# Patient Record
Sex: Female | Born: 1945 | Race: Black or African American | Hispanic: No | Marital: Single | Smoking: Never smoker
Health system: Southern US, Community
[De-identification: ages and names within clinical notes are randomized; demographics above are authoritative.]

## PROBLEM LIST (undated history)

## (undated) DIAGNOSIS — K589 Irritable bowel syndrome without diarrhea: Secondary | ICD-10-CM

## (undated) DIAGNOSIS — E119 Type 2 diabetes mellitus without complications: Secondary | ICD-10-CM

## (undated) DIAGNOSIS — Z860100 Personal history of colon polyps, unspecified: Secondary | ICD-10-CM

## (undated) DIAGNOSIS — F32A Depression, unspecified: Secondary | ICD-10-CM

## (undated) DIAGNOSIS — F329 Major depressive disorder, single episode, unspecified: Secondary | ICD-10-CM

## (undated) DIAGNOSIS — F419 Anxiety disorder, unspecified: Secondary | ICD-10-CM

## (undated) DIAGNOSIS — M1990 Unspecified osteoarthritis, unspecified site: Secondary | ICD-10-CM

## (undated) DIAGNOSIS — K5792 Diverticulitis of intestine, part unspecified, without perforation or abscess without bleeding: Secondary | ICD-10-CM

## (undated) DIAGNOSIS — E785 Hyperlipidemia, unspecified: Secondary | ICD-10-CM

## (undated) HISTORY — DX: Anxiety disorder, unspecified: F41.9

## (undated) HISTORY — PX: CATARACT EXTRACTION, BILATERAL: SHX1313

## (undated) HISTORY — DX: Personal history of colonic polyps: Z86.010

## (undated) HISTORY — DX: Personal history of colon polyps, unspecified: Z86.0100

## (undated) HISTORY — DX: Type 2 diabetes mellitus without complications: E11.9

## (undated) HISTORY — PX: COLONOSCOPY: SHX174

## (undated) HISTORY — DX: Depression, unspecified: F32.A

## (undated) HISTORY — DX: Hyperlipidemia, unspecified: E78.5

## (undated) HISTORY — PX: ABDOMINAL EXPLORATION SURGERY: SHX538

## (undated) HISTORY — DX: Unspecified osteoarthritis, unspecified site: M19.90

## (undated) HISTORY — DX: Major depressive disorder, single episode, unspecified: F32.9

## (undated) HISTORY — PX: ABDOMINAL HYSTERECTOMY: SHX81

## (undated) HISTORY — PX: TONSILLECTOMY: SUR1361

## (undated) HISTORY — PX: BREAST BIOPSY: SHX20

## (undated) HISTORY — DX: Irritable bowel syndrome, unspecified: K58.9

## (undated) HISTORY — DX: Diverticulitis of intestine, part unspecified, without perforation or abscess without bleeding: K57.92

## (undated) HISTORY — PX: DILATION AND CURETTAGE OF UTERUS: SHX78

## (undated) HISTORY — PX: POLYPECTOMY: SHX149

---

## 2012-01-16 LAB — HM COLONOSCOPY

## 2015-08-12 DIAGNOSIS — E119 Type 2 diabetes mellitus without complications: Secondary | ICD-10-CM | POA: Diagnosis not present

## 2015-08-12 DIAGNOSIS — K5792 Diverticulitis of intestine, part unspecified, without perforation or abscess without bleeding: Secondary | ICD-10-CM | POA: Diagnosis not present

## 2015-10-01 ENCOUNTER — Other Ambulatory Visit (INDEPENDENT_AMBULATORY_CARE_PROVIDER_SITE_OTHER): Payer: Medicare Other

## 2015-10-01 ENCOUNTER — Ambulatory Visit (INDEPENDENT_AMBULATORY_CARE_PROVIDER_SITE_OTHER): Payer: Medicare Other | Admitting: Internal Medicine

## 2015-10-01 ENCOUNTER — Encounter: Payer: Self-pay | Admitting: Internal Medicine

## 2015-10-01 VITALS — BP 130/82 | HR 87 | Temp 98.2°F | Resp 16 | Ht 63.0 in | Wt 215.0 lb

## 2015-10-01 DIAGNOSIS — F32A Depression, unspecified: Secondary | ICD-10-CM | POA: Insufficient documentation

## 2015-10-01 DIAGNOSIS — K5732 Diverticulitis of large intestine without perforation or abscess without bleeding: Secondary | ICD-10-CM | POA: Diagnosis not present

## 2015-10-01 DIAGNOSIS — M1711 Unilateral primary osteoarthritis, right knee: Secondary | ICD-10-CM | POA: Insufficient documentation

## 2015-10-01 DIAGNOSIS — M129 Arthropathy, unspecified: Secondary | ICD-10-CM

## 2015-10-01 DIAGNOSIS — E785 Hyperlipidemia, unspecified: Secondary | ICD-10-CM

## 2015-10-01 DIAGNOSIS — F329 Major depressive disorder, single episode, unspecified: Secondary | ICD-10-CM

## 2015-10-01 DIAGNOSIS — F419 Anxiety disorder, unspecified: Secondary | ICD-10-CM | POA: Insufficient documentation

## 2015-10-01 DIAGNOSIS — E119 Type 2 diabetes mellitus without complications: Secondary | ICD-10-CM

## 2015-10-01 LAB — CBC WITH DIFFERENTIAL/PLATELET
BASOS ABS: 0 10*3/uL (ref 0.0–0.1)
Basophils Relative: 0.7 % (ref 0.0–3.0)
Eosinophils Absolute: 0.2 10*3/uL (ref 0.0–0.7)
Eosinophils Relative: 3.3 % (ref 0.0–5.0)
HCT: 40.3 % (ref 36.0–46.0)
HEMOGLOBIN: 13.6 g/dL (ref 12.0–15.0)
LYMPHS ABS: 2.6 10*3/uL (ref 0.7–4.0)
Lymphocytes Relative: 42 % (ref 12.0–46.0)
MCHC: 33.8 g/dL (ref 30.0–36.0)
MCV: 83.7 fl (ref 78.0–100.0)
MONOS PCT: 7.7 % (ref 3.0–12.0)
Monocytes Absolute: 0.5 10*3/uL (ref 0.1–1.0)
NEUTROS PCT: 46.3 % (ref 43.0–77.0)
Neutro Abs: 2.9 10*3/uL (ref 1.4–7.7)
Platelets: 282 10*3/uL (ref 150.0–400.0)
RBC: 4.81 Mil/uL (ref 3.87–5.11)
RDW: 15.1 % (ref 11.5–15.5)
WBC: 6.2 10*3/uL (ref 4.0–10.5)

## 2015-10-01 LAB — COMPREHENSIVE METABOLIC PANEL
ALBUMIN: 4.1 g/dL (ref 3.5–5.2)
ALK PHOS: 103 U/L (ref 39–117)
ALT: 21 U/L (ref 0–35)
AST: 23 U/L (ref 0–37)
BILIRUBIN TOTAL: 0.7 mg/dL (ref 0.2–1.2)
BUN: 11 mg/dL (ref 6–23)
CO2: 29 mEq/L (ref 19–32)
Calcium: 9.4 mg/dL (ref 8.4–10.5)
Chloride: 104 mEq/L (ref 96–112)
Creatinine, Ser: 1.07 mg/dL (ref 0.40–1.20)
GFR: 65.37 mL/min (ref >60.00)
GLUCOSE: 165 mg/dL — AB (ref 70–99)
Potassium: 4.5 mEq/L (ref 3.5–5.1)
SODIUM: 139 meq/L (ref 135–145)
TOTAL PROTEIN: 7.3 g/dL (ref 6.0–8.3)

## 2015-10-01 LAB — MICROALBUMIN / CREATININE URINE RATIO
Creatinine,U: 230.4 mg/dL
MICROALB UR: 10.1 mg/dL — AB (ref 0.0–1.9)
MICROALB/CREAT RATIO: 4.4 mg/g (ref 0.0–30.0)

## 2015-10-01 LAB — LIPID PANEL
CHOLESTEROL: 190 mg/dL (ref 0–200)
HDL: 47.4 mg/dL (ref >39.00)
LDL Cholesterol: 107 mg/dL — ABNORMAL HIGH (ref 0–99)
NonHDL: 142.57
Total CHOL/HDL Ratio: 4
Triglycerides: 178 mg/dL — ABNORMAL HIGH (ref 0.0–149.0)
VLDL: 35.6 mg/dL (ref 0.0–40.0)

## 2015-10-01 LAB — TSH: TSH: 2.23 u[IU]/mL (ref 0.35–4.50)

## 2015-10-01 LAB — HEMOGLOBIN A1C: HEMOGLOBIN A1C: 7 % — AB (ref 4.6–6.5)

## 2015-10-01 NOTE — Assessment & Plan Note (Signed)
Recent episode treated with flagyl / cipro Symptoms have resolved She will let me know if she has recurrent symptoms - will need to start antibiotics quickly Will get out records to review last colonoscopy

## 2015-10-01 NOTE — Assessment & Plan Note (Signed)
Controlled per patient Continue metformin at current dose Check a1c, urine micro Stressed regular exercise, weight loss Continue diabetic diet

## 2015-10-01 NOTE — Assessment & Plan Note (Signed)
Taking crestor 40 mg daily - no side effects Check lipid panel, tsh, cmp Stressed regular exercise, weight loss

## 2015-10-01 NOTE — Progress Notes (Signed)
Subjective:    Patient ID: Veronica Carrillo, female    DOB: 1945/09/29, 70 y.o.   MRN: [id number]  HPI She is here to establish with a new pcp.  She moved recently from [place].   Diabetes: She is taking her medication daily as prescribed. She is compliant with a diabetic diet. She is exercising, but not as much as she should.  Her diabetes has been well controlled. She checks her feet daily and denies foot lesions. She is up-to-date with an ophthalmology examination.   Hyperlipidemia: She is taking her medication daily. She is compliant with a low fat/cholesterol diet. She is not currently exercising regularly. She denies myalgias.   Depression, anxiety: She is taking her medication daily as prescribed. She denies any side effects from the medication. She feels her depression and anxiety are well controlled and she is happy with her current dose of medication.   Diverticulitis:  She has had 4-5 episodes of diverticulitis. She had an episode last month and had to go to urgent care.  They did prescribe her flagyl and cipro and she feels her symptoms have resolved.  She has had a colonoscopy in the past and is unsure if she is due for one now. She currently denies fever, abdominal pain and her BM are normal.  Medications and allergies reviewed with patient and updated if appropriate.  Patient Active Problem List   Diagnosis Date Noted  . Hyperlipidemia 10/01/2015  . Diverticulitis of colon 10/01/2015  . Diabetes ([place]) 10/01/2015  . Depression 10/01/2015  . Anxiety 10/01/2015    No current outpatient prescriptions on file prior to visit.   No current facility-administered medications on file prior to visit.    Past Medical History  Diagnosis Date  . Hyperlipidemia   . Depression   . Diabetes mellitus without complication ([place])   . Diverticulitis   . History of colon polyps     Past Surgical History  Procedure Laterality Date  . Abdominal hysterectomy    . Tonsillectomy       Social History   Social History  . Marital Status: Single    Spouse Name: N/A  . Number of Children: N/A  . Years of Education: N/A   Social History Main Topics  . Smoking status: Never Smoker   . Smokeless tobacco: Never Used  . Alcohol Use: No  . Drug Use: No  . Sexual Activity: Not Asked   Other Topics Concern  . None   Social History Narrative   Exercise: [employer], walking      Moved from [place]    Family History  Problem Relation Age of Onset  . Hypertension Mother   . Bladder Cancer Father   . Heart disease Father   . Diabetes Paternal Grandmother     Review of Systems  Constitutional: Negative for fever, chills and appetite change.  Eyes: Negative for visual disturbance.  Respiratory: Positive for cough (occasional, dry). Negative for shortness of breath and wheezing.   Cardiovascular: Negative for chest pain, palpitations and leg swelling.  Gastrointestinal: Negative for nausea, abdominal pain, diarrhea, constipation and blood in stool.       No gerd  Genitourinary: Negative for dysuria and hematuria.  Musculoskeletal: Positive for arthralgias (right knee pain, occ). Negative for myalgias and back pain.  Neurological: Negative for dizziness, light-headedness, numbness and headaches.  Psychiatric/Behavioral: Positive for dysphoric mood (controlled). The patient is nervous/anxious (controlled).        Objective:   Filed Vitals:  10/01/15 0759  BP: 130/82  Pulse: 87  Temp: 98.2 F (36.8 C)  Resp: 16   Filed Weights   10/01/15 0759  Weight: 215 lb (97.523 kg)   Body mass index is 38.09 kg/(m^2).   Physical Exam Constitutional: She appears well-developed and well-nourished. No distress.  HENT:  Head: Normocephalic and atraumatic.  Right Ear: External ear normal. Normal ear canal and TM Left Ear: External ear normal.  Normal ear canal and TM Mouth/Throat: Oropharynx is clear and moist.  Eyes: Conjunctivae and EOM are normal.  Neck: Neck supple.  No tracheal deviation present. No thyromegaly present.  No carotid bruit  Cardiovascular: Normal rate, regular rhythm and normal heart sounds.   No murmur heard.  No edema. Pulmonary/Chest: Effort normal and breath sounds normal. No respiratory distress. She has no wheezes. She has no rales.  Abdominal: Soft. She exhibits no distension. There is no tenderness.  Lymphadenopathy: She has no cervical adenopathy.  Skin: Skin is warm and dry. She is not diaphoretic.  Psychiatric: She has a normal mood and affect. Her behavior is normal.       Assessment & Plan:   See Problem List for Assessment and Plan of chronic medical problems.   F/u in 6 months  Binnie Rail, MD

## 2015-10-01 NOTE — Assessment & Plan Note (Signed)
Controlled, stable Continue current dose of medication  

## 2015-10-01 NOTE — Progress Notes (Signed)
Pre visit review using our clinic review tool, if applicable. No additional management support is needed unless otherwise documented below in the visit note. 

## 2015-10-01 NOTE — Patient Instructions (Addendum)
  Test(s) ordered today. Your results will be released to MyChart (or called to you) after review, usually within 72hours after test completion. If any changes need to be made, you will be notified at that same time.  No immunizations administered today.   Medications reviewed and updated.  No changes recommended at this time.  Please followup in 6 months   

## 2015-10-07 ENCOUNTER — Telehealth: Payer: Self-pay | Admitting: Internal Medicine

## 2015-10-07 NOTE — Telephone Encounter (Signed)
Rec'd [facility] forward 7 pages to Dr.Burns

## 2015-10-11 ENCOUNTER — Telehealth: Payer: Self-pay | Admitting: Internal Medicine

## 2015-10-11 NOTE — Telephone Encounter (Signed)
Rec'd from [facility] forward 172 pages to Dr.Burns

## 2015-12-06 ENCOUNTER — Encounter: Payer: Self-pay | Admitting: Internal Medicine

## 2015-12-06 ENCOUNTER — Ambulatory Visit (INDEPENDENT_AMBULATORY_CARE_PROVIDER_SITE_OTHER): Payer: Medicare Other | Admitting: Internal Medicine

## 2015-12-06 VITALS — BP 152/88 | HR 99 | Temp 98.1°F | Resp 16 | Wt 216.0 lb

## 2015-12-06 DIAGNOSIS — K5732 Diverticulitis of large intestine without perforation or abscess without bleeding: Secondary | ICD-10-CM | POA: Diagnosis not present

## 2015-12-06 MED ORDER — CIPROFLOXACIN HCL 500 MG PO TABS: 500.0000 mg | ORAL_TABLET | Freq: Two times a day (BID) | ORAL | 0 refills | Status: DC

## 2015-12-06 MED ORDER — METRONIDAZOLE 500 MG PO TABS: 500.0000 mg | ORAL_TABLET | Freq: Three times a day (TID) | ORAL | 0 refills | Status: DC

## 2015-12-06 NOTE — Progress Notes (Signed)
Pre visit review using our clinic review tool, if applicable. No additional management support is needed unless otherwise documented below in the visit note. 

## 2015-12-06 NOTE — Patient Instructions (Addendum)
A ct scan has been ordered.    Antibiotics have been sent to your pharmacy.  Take them as prescribed.    Call if you are not feeling better or if you feel worse.      Diverticulitis Diverticulitis is inflammation or infection of small pouches in your colon that form when you have a condition called diverticulosis. The pouches in your colon are called diverticula. Your colon, or large intestine, is where water is absorbed and stool is formed. Complications of diverticulitis can include:  Bleeding.  Severe infection.  Severe pain.  Perforation of your colon.  Obstruction of your colon. CAUSES  Diverticulitis is caused by bacteria. Diverticulitis happens when stool becomes trapped in diverticula. This allows bacteria to grow in the diverticula, which can lead to inflammation and infection. RISK FACTORS People with diverticulosis are at risk for diverticulitis. Eating a diet that does not include enough fiber from fruits and vegetables may make diverticulitis more likely to develop. SYMPTOMS  Symptoms of diverticulitis may include:  Abdominal pain and tenderness. The pain is normally located on the left side of the abdomen, but may occur in other areas.  Fever and chills.  Bloating.  Cramping.  Nausea.  Vomiting.  Constipation.  Diarrhea.  Blood in your stool. DIAGNOSIS  Your health care provider will ask you about your medical history and do a physical exam. You may need to have tests done because many medical conditions can cause the same symptoms as diverticulitis. Tests may include:  Blood tests.  Urine tests.  Imaging tests of the abdomen, including X-rays and CT scans. When your condition is under control, your health care provider may recommend that you have a colonoscopy. A colonoscopy can show how severe your diverticula are and whether something else is causing your symptoms. TREATMENT  Most cases of diverticulitis are mild and can be treated at home.  Treatment may include:  Taking over-the-counter pain medicines.  Following a clear liquid diet.  Taking antibiotic medicines by mouth for 7-10 days. More severe cases may be treated at a hospital. Treatment may include:  Not eating or drinking.  Taking prescription pain medicine.  Receiving antibiotic medicines through an IV tube.  Receiving fluids and nutrition through an IV tube.  Surgery. HOME CARE INSTRUCTIONS   Follow your health care provider's instructions carefully.  Follow a full liquid diet or other diet as directed by your health care provider. After your symptoms improve, your health care provider may tell you to change your diet. He or she may recommend you eat a high-fiber diet. Fruits and vegetables are good sources of fiber. Fiber makes it easier to pass stool.  Take fiber supplements or probiotics as directed by your health care provider.  Only take medicines as directed by your health care provider.  Keep all your follow-up appointments. SEEK MEDICAL CARE IF:   Your pain does not improve.  You have a hard time eating food.  Your bowel movements do not return to normal. SEEK IMMEDIATE MEDICAL CARE IF:   Your pain becomes worse.  Your symptoms do not get better.  Your symptoms suddenly get worse.  You have a fever.  You have repeated vomiting.  You have bloody or black, tarry stools. MAKE SURE YOU:   Understand these instructions.  Will watch your condition.  Will get help right away if you are not doing well or get worse.   This information is not intended to replace advice given to you by your health care  provider. Make sure you discuss any questions you have with your health care provider.   Document Released: 01/04/2005 Document Revised: 04/01/2013 Document Reviewed: 02/19/2013 Elsevier Interactive Patient Education [employer].

## 2015-12-06 NOTE — Progress Notes (Signed)
Subjective:    Patient ID: Veronica Carrillo, female    DOB: [birth year], 70 y.o.   MRN: [id number]  HPI He is here for an acute visit.   Two days ago she started having abdominal pain.  It was a deep hurt. It is located in the LLQ.  Yesterday she still had pain.  She did not feel like eating, but drank broth.  Today the pain is still there, but varies in intensity.  She states less frequent bowels - the stool is smaller.  She denies blood in the stool.  She denies fever/chills. She states decreased appetite.  She feels this is similar to her other diverticulitis episodes.   Her last episode was in May of this year.  She has had at least 4 other episodes.    Medications and allergies reviewed with patient and updated if appropriate.  Patient Active Problem List   Diagnosis Date Noted  . Hyperlipidemia 10/01/2015  . Diverticulitis of colon 10/01/2015  . Diabetes ([place]) 10/01/2015  . Depression 10/01/2015  . Anxiety 10/01/2015  . Arthritis of right knee 10/01/2015    Current Outpatient Prescriptions on File Prior to Visit  Medication Sig Dispense Refill  . metFORMIN (GLUCOPHAGE) 500 MG tablet Take by mouth daily.    . rosuvastatin (CRESTOR) 40 MG tablet Take 40 mg by mouth daily.    Marland Kitchen venlafaxine XR (EFFEXOR-XR) 75 MG 24 hr capsule Take 75 mg by mouth daily with breakfast.     No current facility-administered medications on file prior to visit.     Past Medical History:  Diagnosis Date  . Depression   . Diabetes mellitus without complication ([place])   . Diverticulitis   . History of colon polyps   . Hyperlipidemia     Past Surgical History:  Procedure Laterality Date  . ABDOMINAL HYSTERECTOMY    . TONSILLECTOMY      Social History   Social History  . Marital status: Single    Spouse name: N/A  . Number of children: N/A  . Years of education: N/A   Social History Main Topics  . Smoking status: Never Smoker  . Smokeless tobacco: Never Used  . Alcohol use No  .  Drug use: No  . Sexual activity: Not on file   Other Topics Concern  . Not on file   Social History Narrative   Exercise: [employer], walking      Moved from [place]    Family History  Problem Relation Age of Onset  . Hypertension Mother   . Bladder Cancer Father   . Heart disease Father   . Diabetes Paternal Grandmother     Review of Systems  Constitutional: Positive for appetite change (decreased) and fatigue. Negative for chills and fever.  Gastrointestinal: Positive for abdominal pain and constipation. Negative for blood in stool, diarrhea and nausea.  Genitourinary: Negative for dysuria and hematuria.  Neurological: Negative for numbness and headaches.       Objective:   Vitals:   12/06/15 1433  BP: (!) 152/88  Pulse: 99  Resp: 16  Temp: 98.1 F (36.7 C)   Filed Weights   12/06/15 1433  Weight: 216 lb (98 kg)   Body mass index is 38.26 kg/m.   Physical Exam  Constitutional: She appears well-developed and well-nourished. No distress.  Abdominal: Soft. She exhibits no distension and no mass. There is tenderness (LLQ and LMQ tenderness with palpation ). There is no rebound and no guarding.  Skin: She is  not diaphoretic.       Assessment & Plan:   See Problem List for Assessment and Plan of chronic medical problems.

## 2015-12-06 NOTE — Assessment & Plan Note (Signed)
Her symptoms are consistent with her prior episodes of diverticulitis Start flagyl and cipro today Ct of abd today or tomorrow - she had a prior Ct scan in 2008 that mentioned possible microperforation (she was not aware of this) Colonoscopy done 01/2012 - repeat in 5 years May need to see GI prior to October 2018 Follow up if symptoms do not improve or worsen

## 2015-12-07 ENCOUNTER — Other Ambulatory Visit (INDEPENDENT_AMBULATORY_CARE_PROVIDER_SITE_OTHER): Payer: Medicare Other

## 2015-12-07 ENCOUNTER — Telehealth: Payer: Self-pay | Admitting: Emergency Medicine

## 2015-12-07 ENCOUNTER — Ambulatory Visit (INDEPENDENT_AMBULATORY_CARE_PROVIDER_SITE_OTHER)
Admission: RE | Admit: 2015-12-07 | Discharge: 2015-12-07 | Disposition: A | Discharge status: Home / Self Care | Payer: Medicare Other | Source: Ambulatory Visit | Attending: Internal Medicine | Admitting: Internal Medicine

## 2015-12-07 ENCOUNTER — Telehealth: Payer: Self-pay | Admitting: Internal Medicine

## 2015-12-07 DIAGNOSIS — Z298 Encounter for other specified prophylactic measures: Secondary | ICD-10-CM

## 2015-12-07 DIAGNOSIS — K5732 Diverticulitis of large intestine without perforation or abscess without bleeding: Secondary | ICD-10-CM

## 2015-12-07 DIAGNOSIS — Z418 Encounter for other procedures for purposes other than remedying health state: Secondary | ICD-10-CM | POA: Diagnosis not present

## 2015-12-07 LAB — BASIC METABOLIC PANEL
BUN: 10 mg/dL (ref 6–23)
CALCIUM: 8.8 mg/dL (ref 8.4–10.5)
CO2: 28 mEq/L (ref 19–32)
Chloride: 104 mEq/L (ref 96–112)
Creatinine, Ser: 1.19 mg/dL (ref 0.40–1.20)
GFR: 57.79 mL/min — AB (ref >60.00)
Glucose, Bld: 218 mg/dL — ABNORMAL HIGH (ref 70–99)
Potassium: 4.2 mEq/L (ref 3.5–5.1)
SODIUM: 139 meq/L (ref 135–145)

## 2015-12-07 MED ORDER — IOPAMIDOL (ISOVUE-300) INJECTION 61%
80.0000 mL | Freq: Once | INTRAVENOUS | Status: AC | PRN
  Administered 2015-12-07: 80 mL via INTRAVENOUS

## 2015-12-07 NOTE — Telephone Encounter (Signed)
BMET needed for Pts CT scan at 330 pm this afternoon.

## 2015-12-07 NOTE — Telephone Encounter (Signed)
Pt came by office to pick up her contrast for her procedure this afternoon.  She wanted to let you know she feels really bad and had a horrible smelling accident yesterday as she was leaving the office.  I am passing this along to you per pt's request.

## 2015-12-07 NOTE — Telephone Encounter (Signed)
noted 

## 2015-12-07 NOTE — Telephone Encounter (Signed)
Just FYI.

## 2015-12-14 ENCOUNTER — Telehealth: Payer: Self-pay

## 2015-12-14 NOTE — Telephone Encounter (Signed)
Spoke with pt, she states she is unable to hold her bowel movements and is currently having to use depends. Pt is wanting to know if she could be written out of work for the remainder of the week because she is unable to make it through the shift without having an accident. Please advise.

## 2015-12-14 NOTE — Telephone Encounter (Signed)
Patient called and needed to talk to [facility]. She would not go into detail. Please follow up, thank you!

## 2015-12-14 NOTE — Telephone Encounter (Signed)
Ok to write her out of work

## 2015-12-15 ENCOUNTER — Encounter: Payer: Self-pay | Admitting: Emergency Medicine

## 2015-12-15 NOTE — Telephone Encounter (Signed)
Work not has been faxed to pts employer

## 2015-12-28 ENCOUNTER — Ambulatory Visit (INDEPENDENT_AMBULATORY_CARE_PROVIDER_SITE_OTHER): Payer: Medicare Other | Admitting: Nurse Practitioner

## 2015-12-28 ENCOUNTER — Encounter: Payer: Self-pay | Admitting: Nurse Practitioner

## 2015-12-28 VITALS — BP 134/64 | HR 86 | Temp 98.2°F | Ht 63.0 in | Wt 217.0 lb

## 2015-12-28 DIAGNOSIS — K5732 Diverticulitis of large intestine without perforation or abscess without bleeding: Secondary | ICD-10-CM | POA: Diagnosis not present

## 2015-12-28 DIAGNOSIS — R197 Diarrhea, unspecified: Secondary | ICD-10-CM | POA: Diagnosis not present

## 2015-12-28 MED ORDER — SACCHAROMYCES BOULARDII 250 MG PO CAPS: 250.0000 mg | ORAL_CAPSULE | Freq: Two times a day (BID) | ORAL | 0 refills | Status: DC

## 2015-12-28 MED ORDER — BISMUTH SUBSALICYLATE 262 MG/15ML PO SUSP: 30.0000 mL | Freq: Three times a day (TID) | ORAL | 0 refills | Status: DC

## 2015-12-28 MED ORDER — METRONIDAZOLE 500 MG PO TABS: 500.0000 mg | ORAL_TABLET | Freq: Three times a day (TID) | ORAL | 0 refills | Status: DC

## 2015-12-28 NOTE — Patient Instructions (Addendum)
Start with clear liquid diet, advance as described on diet sheet. Return stool to lab ASAP. Go to lab for stool sample containers. Push oral hydration. You will be called with appt to GI.  Diverticulitis Diverticulitis is when small pockets that have formed in your colon (large intestine) become infected or swollen. HOME CARE  Follow your doctor's instructions.  Follow a special diet if told by your doctor.  When you feel better, your doctor may tell you to change your diet. You may be told to eat a lot of fiber. Fruits and vegetables are good sources of fiber. Fiber makes it easier to poop (have bowel movements).  Take supplements or probiotics as told by your doctor.  Only take medicines as told by your doctor.  Keep all follow-up visits with your doctor. GET HELP IF:  Your pain does not get better.  You have a hard time eating food.  You are not pooping like normal. GET HELP RIGHT AWAY IF:  Your pain gets worse.  Your problems do not get better.  Your problems suddenly get worse.  You have a fever.  You keep throwing up (vomiting).  You have bloody or black, tarry poop (stool). MAKE SURE YOU:   Understand these instructions.  Will watch your condition.  Will get help right away if you are not doing well or get worse.   This information is not intended to replace advice given to you by your health care provider. Make sure you discuss any questions you have with your health care provider.   Document Released: 09/13/2007 Document Revised: 04/01/2013 Document Reviewed: 02/19/2013 Elsevier Interactive Patient Education [employer].

## 2015-12-28 NOTE — Progress Notes (Signed)
Subjective:  Patient ID: Veronica Carrillo, female    DOB: December 15, 1945  Age: 70 y.o. MRN: [id number]  CC: Abdominal Pain (PT STATED HAVE HISTORY DIVERTICULITIS SEEN DR. Claudine Mouton AND BUT STILL HAVING DIARRHEA/PAIN )  Abdominal Pain  This is a new problem. The current episode started 1 to 4 weeks ago. The onset quality is gradual. The problem occurs intermittently. The problem has been gradually improving (improved with metronidazole and cipro). The pain is located in the LLQ and suprapubic region. The pain is at a severity of 4/10. The pain is moderate. The quality of the pain is colicky and cramping. The abdominal pain does not radiate. Associated symptoms include diarrhea and flatus. Pertinent negatives include no anorexia, dysuria, frequency, hematochezia, hematuria, melena or nausea. Associated symptoms comments: Had 2episodes of stool incontinence (liquid) yesterday. The pain is aggravated by eating. The pain is relieved by nothing. She has tried nothing for the symptoms. Prior diagnostic workup includes CT scan. There is no history of Crohn's disease, GERD, irritable bowel syndrome or pancreatitis.    Outpatient Medications Prior to Visit  Medication Sig Dispense Refill  . metFORMIN (GLUCOPHAGE) 500 MG tablet Take by mouth daily.    . rosuvastatin (CRESTOR) 40 MG tablet Take 40 mg by mouth daily.    Marland Kitchen venlafaxine XR (EFFEXOR-XR) 75 MG 24 hr capsule Take 75 mg by mouth daily with breakfast.    . ciprofloxacin (CIPRO) 500 MG tablet Take 1 tablet (500 mg total) by mouth 2 (two) times daily. (Patient not taking: Reported on 12/28/2015) 20 tablet 0  . metroNIDAZOLE (FLAGYL) 500 MG tablet Take 1 tablet (500 mg total) by mouth 3 (three) times daily. (Patient not taking: Reported on 12/28/2015) 30 tablet 0   No facility-administered medications prior to visit.     ROS See HPI  Objective:  BP 134/64 (BP Location: Left Arm, Patient Position: Sitting, Cuff Size: Large)   Pulse 86   Temp 98.2 F (36.8  C)   Ht 5\' 3"  (1.6 m)   Wt 217 lb 0.6 oz (98.4 kg)   SpO2 98%   BMI 38.45 kg/m   BP Readings from Last 3 Encounters:  12/28/15 134/64  12/06/15 (!) 152/88  10/01/15 130/82    Wt Readings from Last 3 Encounters:  12/28/15 217 lb 0.6 oz (98.4 kg)  12/06/15 216 lb (98 kg)  10/01/15 215 lb (97.5 kg)    Physical Exam  Constitutional: She is oriented to person, place, and time. She appears distressed.  Cardiovascular: Normal rate and normal heart sounds.   Pulmonary/Chest: Effort normal and breath sounds normal.  Abdominal: Soft. She exhibits no distension. There is tenderness. There is no rebound and no guarding.  Neurological: She is alert and oriented to person, place, and time.  Skin: Skin is warm and dry.  Vitals reviewed.   Lab Results  Component Value Date   WBC 6.2 10/01/2015   HGB 13.6 10/01/2015   HCT 40.3 10/01/2015   PLT 282.0 10/01/2015   GLUCOSE 218 (H) 12/07/2015   CHOL 190 10/01/2015   TRIG 178.0 (H) 10/01/2015   HDL 47.40 10/01/2015   LDLCALC 107 (H) 10/01/2015   ALT 21 10/01/2015   AST 23 10/01/2015   NA 139 12/07/2015   K 4.2 12/07/2015   CL 104 12/07/2015   CREATININE 1.19 12/07/2015   BUN 10 12/07/2015   CO2 28 12/07/2015   TSH 2.23 10/01/2015   HGBA1C 7.0 (H) 10/01/2015   MICROALBUR 10.1 (H) 10/01/2015  Ct Abdomen Pelvis W Contrast  Result Date: 12/07/2015 CLINICAL DATA:  History of recurrent diverticulitis for years. Multiple flare ups. Lower abdominal pain. Nausea. Diarrhea. Diabetes. EXAM: CT ABDOMEN AND PELVIS WITH CONTRAST TECHNIQUE: Multidetector CT imaging of the abdomen and pelvis was performed using the standard protocol following bolus administration of intravenous contrast. CONTRAST:  26mL ISOVUE-300 IOPAMIDOL (ISOVUE-300) INJECTION 61% COMPARISON:  None. FINDINGS: Lower chest: Clear lung bases. Normal heart size without pericardial or pleural effusion. Hepatobiliary: Suspicion of mild hepatic steatosis, without focal liver lesion.  Normal gallbladder, without biliary ductal dilatation. Pancreas: Normal, without mass or ductal dilatation. Spleen: Normal in size, without focal abnormality. Adrenals/Urinary Tract: Normal adrenal glands. Left worse than right renal sinus cysts. Normal kidneys otherwise, without hydronephrosis or hydroureter. Normal urinary bladder. Stomach/Bowel: Normal stomach, without wall thickening. Scattered colonic diverticula. Wall thickening of and edema surrounding the proximal sigmoid, including on images 52-57 of series 2. No extraluminal gas or pericolonic fluid collection. Normal terminal ileum and appendix. Normal small bowel. Vascular/Lymphatic: Aortic and branch vessel atherosclerosis. No abdominopelvic adenopathy. Reproductive: Hysterectomy.  No adnexal mass. Other: No significant free fluid. Musculoskeletal: Mild bilateral hip osteoarthritis. IMPRESSION: 1. Moderate, non complicated proximal sigmoid diverticulitis. 2.  Aortic atherosclerosis. 3. Suspicion of hepatic steatosis. Electronically Signed   By: Veronica Carrillo M.D.   On: 12/07/2015 16:01    Assessment & Plan:   Veronica Carrillo was seen today for abdominal pain.  Diagnoses and all orders for this visit:  Diverticulitis of colon -     Ambulatory referral to Gastroenterology -     Discontinue: saccharomyces boulardii (FLORASTOR) 250 MG capsule; Take 1 capsule (250 mg total) by mouth 2 (two) times daily. -     bismuth subsalicylate (PEPTO-BISMOL) 262 MG/15ML suspension; Take 30 mLs by mouth 4 (four) times daily -  before meals and at bedtime. -     Clostridium difficile EIA; Future -     Stool Culture; Future -     Discontinue: metroNIDAZOLE (FLAGYL) 500 MG tablet; Take 1 tablet (500 mg total) by mouth 3 (three) times daily. -     metroNIDAZOLE (FLAGYL) 500 MG tablet; Take 1 tablet (500 mg total) by mouth 3 (three) times daily. -     saccharomyces boulardii (FLORASTOR) 250 MG capsule; Take 1 capsule (250 mg total) by mouth 2 (two) times  daily.  Diarrhea, unspecified type -     Ambulatory referral to Gastroenterology -     Discontinue: saccharomyces boulardii (FLORASTOR) 250 MG capsule; Take 1 capsule (250 mg total) by mouth 2 (two) times daily. -     bismuth subsalicylate (PEPTO-BISMOL) 262 MG/15ML suspension; Take 30 mLs by mouth 4 (four) times daily -  before meals and at bedtime. -     Clostridium difficile EIA; Future -     Stool Culture; Future -     Discontinue: metroNIDAZOLE (FLAGYL) 500 MG tablet; Take 1 tablet (500 mg total) by mouth 3 (three) times daily. -     metroNIDAZOLE (FLAGYL) 500 MG tablet; Take 1 tablet (500 mg total) by mouth 3 (three) times daily. -     saccharomyces boulardii (FLORASTOR) 250 MG capsule; Take 1 capsule (250 mg total) by mouth 2 (two) times daily.   I am having Ms. Collinsworth start on bismuth subsalicylate. I am also having her maintain her rosuvastatin, venlafaxine XR, metFORMIN, ciprofloxacin, metroNIDAZOLE, and saccharomyces boulardii.  Meds ordered this encounter  Medications  . DISCONTD: saccharomyces boulardii (FLORASTOR) 250 MG capsule    Sig: Take 1  capsule (250 mg total) by mouth 2 (two) times daily.    Dispense:  20 capsule    Refill:  0    Order Specific Question:   Supervising Provider    Answer:   Cassandria Anger [[id number]]  . bismuth subsalicylate (PEPTO-BISMOL) 262 MG/15ML suspension    Sig: Take 30 mLs by mouth 4 (four) times daily -  before meals and at bedtime.    Dispense:  360 mL    Refill:  0    Order Specific Question:   Supervising Provider    Answer:   Cassandria Anger [[id number]]  . DISCONTD: metroNIDAZOLE (FLAGYL) 500 MG tablet    Sig: Take 1 tablet (500 mg total) by mouth 3 (three) times daily.    Dispense:  30 tablet    Refill:  0    Order Specific Question:   Supervising Provider    Answer:   Cassandria Anger [[id number]]  . metroNIDAZOLE (FLAGYL) 500 MG tablet    Sig: Take 1 tablet (500 mg total) by mouth 3 (three) times daily.    Dispense:  30 tablet     Refill:  0    Order Specific Question:   Supervising Provider    Answer:   Cassandria Anger [[id number]]  . saccharomyces boulardii (FLORASTOR) 250 MG capsule    Sig: Take 1 capsule (250 mg total) by mouth 2 (two) times daily.    Dispense:  20 capsule    Refill:  0    Order Specific Question:   Supervising Provider    Answer:   Cassandria Anger [[id number]]    Follow-up: Return if symptoms worsen or fail to improve.  Wilfred Lacy, NP

## 2015-12-29 ENCOUNTER — Other Ambulatory Visit: Payer: Medicare Other

## 2015-12-29 ENCOUNTER — Other Ambulatory Visit: Payer: Self-pay | Admitting: Nurse Practitioner

## 2015-12-29 ENCOUNTER — Encounter: Payer: Self-pay | Admitting: Internal Medicine

## 2015-12-29 DIAGNOSIS — R197 Diarrhea, unspecified: Secondary | ICD-10-CM

## 2015-12-29 DIAGNOSIS — K5732 Diverticulitis of large intestine without perforation or abscess without bleeding: Secondary | ICD-10-CM

## 2015-12-30 LAB — C. DIFFICILE GDH AND TOXIN A/B
C. DIFFICILE GDH: NOT DETECTED
C. difficile Toxin A/B: NOT DETECTED

## 2015-12-30 NOTE — Progress Notes (Signed)
Normal results: negative for c.diff. Pending stool culture. Take metronidazole as prescribed F/up with GI when scheduled.

## 2015-12-31 ENCOUNTER — Other Ambulatory Visit: Payer: Self-pay | Admitting: Nurse Practitioner

## 2016-01-02 LAB — C. DIFFICILE GDH AND TOXIN A/B

## 2016-01-02 LAB — STOOL CULTURE

## 2016-01-03 NOTE — Progress Notes (Signed)
Normal results, see office note

## 2016-01-05 ENCOUNTER — Encounter: Payer: Self-pay | Admitting: Internal Medicine

## 2016-01-05 ENCOUNTER — Encounter (INDEPENDENT_AMBULATORY_CARE_PROVIDER_SITE_OTHER): Payer: Self-pay

## 2016-01-05 ENCOUNTER — Ambulatory Visit (INDEPENDENT_AMBULATORY_CARE_PROVIDER_SITE_OTHER): Payer: Medicare Other | Admitting: Internal Medicine

## 2016-01-05 VITALS — BP 154/68 | HR 112 | Ht 62.0 in | Wt 215.1 lb

## 2016-01-05 DIAGNOSIS — R159 Full incontinence of feces: Secondary | ICD-10-CM

## 2016-01-05 DIAGNOSIS — R197 Diarrhea, unspecified: Secondary | ICD-10-CM

## 2016-01-05 DIAGNOSIS — K5732 Diverticulitis of large intestine without perforation or abscess without bleeding: Secondary | ICD-10-CM | POA: Diagnosis not present

## 2016-01-05 MED ORDER — CIPROFLOXACIN HCL 500 MG PO TABS: 500.0000 mg | ORAL_TABLET | Freq: Two times a day (BID) | ORAL | 0 refills | Status: DC

## 2016-01-05 MED ORDER — METRONIDAZOLE 500 MG PO TABS: 500.0000 mg | ORAL_TABLET | Freq: Two times a day (BID) | ORAL | 0 refills | Status: DC

## 2016-01-05 NOTE — Patient Instructions (Signed)
We have sent the following medications to your pharmacy for you to pick up in case of future diverticulitis flares:  cipro and flagyl  Begin using fiber dailly (citracel or metamucil) - 2 tablespoons daily   You will be hearing from [facility] for an appointment with Dr. Kallie Locks have a recall colonoscopy scheduled for 01/2017

## 2016-01-05 NOTE — Progress Notes (Signed)
HISTORY OF PRESENT ILLNESS:  Veronica Carrillo is a 70 y.o. female , [place] native and [place] resident relocated to [place], who is referred today by her primary care physician Dr. Billey Gosling with chief complaints of diarrhea, fecal incontinence, and recurrent diverticulitis. The patient reports 10 year history of recurrent diverticulitis manifested by left lower quadrant abdominal pain with fevers. Symptoms are severe and have required antibiotics. Generally metronidazole and ciprofloxacin. She has had at least one episode per year. She is concerned and wants alternative/preventative measures. Most recent problem occurred late August of this year. On 12/07/2015 she underwent CT scan of the abdomen and pelvis which revealed moderately severe non-complicated proximal sigmoid diverticulitis. She was treated with antibiotics. Pain improved. This is her second bout of diverticulitis this year. She did develop some diarrhea and incontinence. Testing for Clostridium difficile was negative. Problems with diarrhea have improved. I do have a colonoscopy report from [place] [facility] October 2018. She was found to have diverticulosis in the sigmoid and descending colon. As well 5 mm sigmoid colon polyp which was removed. Examination was otherwise normal. She did have random colon biopsies to rule out microscopic colitis. No pathology available that colonoscopy follow-up in 5 years recommended.  REVIEW OF SYSTEMS:  All non-GI ROS negative upon comprehensive review  Past Medical History:  Diagnosis Date  . Depression   . Diabetes mellitus without complication ([place])   . Diverticulitis   . History of colon polyps   . Hyperlipidemia   . IBS (irritable bowel syndrome)     Past Surgical History:  Procedure Laterality Date  . ABDOMINAL EXPLORATION SURGERY    . ABDOMINAL HYSTERECTOMY    . DILATION AND CURETTAGE OF UTERUS     x 3  . TONSILLECTOMY      Social History Veronica Carrillo  reports that she  has never smoked. She has never used smokeless tobacco. She reports that she drinks alcohol. She reports that she does not use drugs.  family history includes Bladder Cancer in her father; Diabetes in her paternal grandmother; Heart disease in her father; Hypertension in her mother.  Allergies  Allergen Reactions  . Codeine Nausea And Vomiting       PHYSICAL EXAMINATION: Vital signs: BP (!) 154/68 (BP Location: Left Arm, Patient Position: Sitting, Cuff Size: Normal)   Pulse (!) 112   Ht 5\' 2"  (1.575 m) Comment: height measured without shoes  Wt 215 lb 2 oz (97.6 kg)   BMI 39.35 kg/m   Constitutional: Pleasant, generally well-appearing, no acute distress Psychiatric: alert and oriented x3, cooperative Eyes: extraocular movements intact, anicteric, conjunctiva pink Mouth: oral pharynx moist, no lesions Neck: supple without thyromegaly Lymph: no lymphadenopathy Cardiovascular: heart regular rate and rhythm, no murmur Lungs: clear to auscultation bilaterally Abdomen: soft, obese, nontender, nondistended, no obvious ascites, no peritoneal signs, normal bowel sounds, no organomegaly Rectal: Omitted Extremities: no clubbing cyanosis or lower extremity edema bilaterally Skin: no lesions on visible extremities Neuro: No focal deficits. Cranial nerves intact  ASSESSMENT:  #1. Recurrent acute diverticulitis. Documented episodes. Ten-year history of such. 2 bouts this year. Fortunately uncomplicated #2. Diarrhea. Antibiotic associated but not Clostridium difficile. Resolved #3. Minor fecal oozing intermittently  #4. Colon polyp on colonoscopy October 2013  PLAN:  #1. Daily fiber supplementation with Metamucil #2. Long discussion on diverticular disease including treatment options #3. Surveillance colonoscopy October 2018. Recall made #4. Prescription for ciprofloxacin and metronidazole twice daily for 10 days to have on hand should she have  a recurrent attack without quick access to  medical personnel. She has promised to contact the office if she decides to initiate antibiotics as we would like document this occurrence and possibly recommend follow-up at that time. #4. The patient was interested in a surgical opinion regarding her diverticular disease. As such, we have arranged referral to Dr. Excell Seltzer for his opinion  A copy of this consultation note has been sent to Dr. Quay Burow and Dr. Excell Seltzer

## 2016-01-07 ENCOUNTER — Telehealth: Payer: Self-pay

## 2016-01-07 NOTE — Telephone Encounter (Signed)
Faxed information requested by [facility] so they can schedule appointment with Dr. Excell Seltzer for patient

## 2016-01-24 ENCOUNTER — Other Ambulatory Visit: Payer: Self-pay | Admitting: *Deleted

## 2016-01-24 MED ORDER — VENLAFAXINE HCL ER 75 MG PO CP24: 75.0000 mg | ORAL_CAPSULE | Freq: Every day | ORAL | 2 refills | Status: DC

## 2016-01-24 MED ORDER — METFORMIN HCL 500 MG PO TABS: 500.0000 mg | ORAL_TABLET | Freq: Every day | ORAL | 2 refills | Status: DC

## 2016-01-24 MED ORDER — ROSUVASTATIN CALCIUM 40 MG PO TABS: 40.0000 mg | ORAL_TABLET | Freq: Every day | ORAL | 2 refills | Status: DC

## 2016-01-26 ENCOUNTER — Telehealth: Payer: Self-pay | Admitting: Emergency Medicine

## 2016-01-26 DIAGNOSIS — E785 Hyperlipidemia, unspecified: Secondary | ICD-10-CM

## 2016-01-26 MED ORDER — ATORVASTATIN CALCIUM 40 MG PO TABS: 40.0000 mg | ORAL_TABLET | Freq: Every day | ORAL | 3 refills | Status: DC

## 2016-01-26 NOTE — Telephone Encounter (Signed)
RX and labs ordered. Pt notified.

## 2016-01-26 NOTE — Telephone Encounter (Signed)
Change to lipitor 40 mg daily.  Need to check lipid, hepatic function in 2 months

## 2016-01-26 NOTE — Telephone Encounter (Signed)
Pt called and stated she was prescribed rosuvastatin (CRESTOR) 40 MG tablet. It is too expensive so she is wondering if there is something cheaper to take. Please advise thanks.

## 2016-01-26 NOTE — Telephone Encounter (Signed)
Please advise 

## 2016-02-18 DIAGNOSIS — K5732 Diverticulitis of large intestine without perforation or abscess without bleeding: Secondary | ICD-10-CM | POA: Diagnosis not present

## 2016-02-18 DIAGNOSIS — R159 Full incontinence of feces: Secondary | ICD-10-CM | POA: Diagnosis not present

## 2016-03-07 DIAGNOSIS — R159 Full incontinence of feces: Secondary | ICD-10-CM | POA: Diagnosis not present

## 2016-03-31 ENCOUNTER — Ambulatory Visit: Payer: Medicare Other | Admitting: Internal Medicine

## 2016-03-31 NOTE — Progress Notes (Deleted)
Subjective:    Patient ID: Veronica Carrillo, female    DOB: 1945/08/09, 70 y.o.   MRN: [id number]  HPI The patient is here for follow up.  Diabetes: She is taking her medication daily as prescribed. She is compliant with a diabetic diet. She is exercising regularly. She monitors her sugars and they have been running XXX. She checks her feet daily and denies foot lesions. She is up-to-date with an ophthalmology examination.   Hyperlipidemia: She is taking her medication daily. She is compliant with a low fat/cholesterol diet. She is exercising regularly. She denies myalgias.   Depression, anxiety: She is taking her medication daily as prescribed. She denies any side effects from the medication. She feels her depression and anxiety are well controlled and she is happy with her current dose of medication.     Medications and allergies reviewed with patient and updated if appropriate.  Patient Active Problem List   Diagnosis Date Noted  . Hyperlipidemia 10/01/2015  . Diverticulitis of colon 10/01/2015  . Diabetes ([place]) 10/01/2015  . Depression 10/01/2015  . Anxiety 10/01/2015  . Arthritis of right knee 10/01/2015    Current Outpatient Prescriptions on File Prior to Visit  Medication Sig Dispense Refill  . atorvastatin (LIPITOR) 40 MG tablet Take 1 tablet (40 mg total) by mouth daily. 90 tablet 3  . bismuth subsalicylate (PEPTO-BISMOL) 262 MG/15ML suspension Take 30 mLs by mouth 4 (four) times daily -  before meals and at bedtime. 360 mL 0  . ciprofloxacin (CIPRO) 500 MG tablet Take 1 tablet (500 mg total) by mouth 2 (two) times daily. 20 tablet 0  . metFORMIN (GLUCOPHAGE) 500 MG tablet Take 1 tablet (500 mg total) by mouth daily with breakfast. 90 tablet 2  . metroNIDAZOLE (FLAGYL) 500 MG tablet Take 1 tablet (500 mg total) by mouth 3 (three) times daily. 30 tablet 0  . metroNIDAZOLE (FLAGYL) 500 MG tablet Take 1 tablet (500 mg total) by mouth 2 (two) times daily. 20 tablet 0  .  saccharomyces boulardii (FLORASTOR) 250 MG capsule Take 1 capsule (250 mg total) by mouth 2 (two) times daily. (Patient taking differently: Take 250 mg by mouth daily. ) 20 capsule 0  . venlafaxine XR (EFFEXOR-XR) 75 MG 24 hr capsule Take 1 capsule (75 mg total) by mouth daily with breakfast. 90 capsule 2   No current facility-administered medications on file prior to visit.     Past Medical History:  Diagnosis Date  . Depression   . Diabetes mellitus without complication ([place])   . Diverticulitis   . History of colon polyps   . Hyperlipidemia   . IBS (irritable bowel syndrome)     Past Surgical History:  Procedure Laterality Date  . ABDOMINAL EXPLORATION SURGERY    . ABDOMINAL HYSTERECTOMY    . DILATION AND CURETTAGE OF UTERUS     x 3  . TONSILLECTOMY      Social History   Social History  . Marital status: Single    Spouse name: N/A  . Number of children: 1  . Years of education: N/A   Occupational History  . retired   . PT recepionist    Social History Main Topics  . Smoking status: Never Smoker  . Smokeless tobacco: Never Used  . Alcohol use 0.0 oz/week     Comment: wine cooler-ocassional  . Drug use: No  . Sexual activity: Not on file   Other Topics Concern  . Not on file   Social  History Narrative   Exercise: [employer], walking      Moved from [place]    Family History  Problem Relation Age of Onset  . Hypertension Mother   . Bladder Cancer Father   . Heart disease Father   . Diabetes Paternal Grandmother     Review of Systems     Objective:  There were no vitals filed for this visit. There were no vitals filed for this visit. There is no height or weight on file to calculate BMI.   Physical Exam    Constitutional: Appears well-developed and well-nourished. No distress.  HENT:  Head: Normocephalic and atraumatic.  Neck: Neck supple. No tracheal deviation present. No thyromegaly present.  No cervical lymphadenopathy Cardiovascular: Normal rate,  regular rhythm and normal heart sounds.   No murmur heard. No carotid bruit .  No edema Pulmonary/Chest: Effort normal and breath sounds normal. No respiratory distress. No has no wheezes. No rales.  Skin: Skin is warm and dry. Not diaphoretic.  Psychiatric: Normal mood and affect. Behavior is normal.      Assessment & Plan:    See Problem List for Assessment and Plan of chronic medical problems.

## 2016-04-20 DIAGNOSIS — N3946 Mixed incontinence: Secondary | ICD-10-CM | POA: Diagnosis not present

## 2016-04-20 DIAGNOSIS — N3944 Nocturnal enuresis: Secondary | ICD-10-CM | POA: Diagnosis not present

## 2016-04-20 DIAGNOSIS — R351 Nocturia: Secondary | ICD-10-CM | POA: Diagnosis not present

## 2016-04-28 DIAGNOSIS — N3946 Mixed incontinence: Secondary | ICD-10-CM | POA: Diagnosis not present

## 2016-05-04 DIAGNOSIS — N3944 Nocturnal enuresis: Secondary | ICD-10-CM | POA: Diagnosis not present

## 2016-05-04 DIAGNOSIS — N3946 Mixed incontinence: Secondary | ICD-10-CM | POA: Diagnosis not present

## 2016-05-08 DIAGNOSIS — R159 Full incontinence of feces: Secondary | ICD-10-CM | POA: Diagnosis not present

## 2016-05-31 ENCOUNTER — Other Ambulatory Visit: Payer: Self-pay | Admitting: General Surgery

## 2016-05-31 DIAGNOSIS — K5732 Diverticulitis of large intestine without perforation or abscess without bleeding: Secondary | ICD-10-CM | POA: Diagnosis not present

## 2016-05-31 NOTE — Progress Notes (Deleted)
History of Present Illness Veronica Ruff MD; 06/29/252 10:27 AM) The patient is a 71 year old female who presents with diverticulitis. She is referred by Dr. Henrene Pastor due to recurrent episodes of sigmoid diverticulitis. She is a 71 year old female originally from [place], recently moved to [place] from [place], [place] to be with her son. She states that for about 10 years she has had multiple recurrent episodes of diverticulitis. She was initially hospitalized for diagnosis. Subsequent episodes have all been treated with outpatient antibiotics. She has not had any specific complications. However she feels the episodes are getting worse and more frequent. She describes onset of left lower quadrant abdominal pain associated with initially diarrhea as well as malaise. This responds to antibiotics. She also describes fecal incontinence with "oozing" of stool which occurs often with the episodes of diverticulitis but sometimes unrelated to diverticulitis particular types of food. This resolves with 1 imodium per day. She has had 2 episodes of diverticulitis since moving to [place]. CT scan of the abdomen and pelvis was obtained on December 07, 2015 showing moderate sigmoid diverticulitis with multiple diverticula and wall thickening and stranding. Hepatic steatosis was noted. Colonoscopy was done in [place] in 2018 showing only diverticulosis. She is on a new med for urinary incontinence that seems to be working well for her.   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 Hysterectomy (not due to cancer) - Partial  Diagnostic Studies History Veronica Ruff, MD; 7/37/1062 10:27 AM) Colonoscopy 1-5 years ago Mammogram 1-3 years ago Pap Smear 1-5 years  ago  Allergies Malachy Moan, RMA; 05/31/2016 9:46 AM) Codeine Phosphate *ANALGESICS - OPIOID*  Medication History Veronica Ruff, MD; 6/94/8546 10:27 AM) Bismuth Subsalicylate (262MG /15ML Suspension, Oral) Active. Ciprofloxacin HCl (500MG  Tablet, Oral) Active. Atorvastatin Calcium (40MG  Tablet, Oral) Active. Venlafaxine HCl ER (75MG  Capsule ER 24HR, Oral) Active. MetFORMIN HCl (500MG  Tablet, Oral) Active. MetroNIDAZOLE (500MG  Tablet, Oral) Active. Medications Reconciled Neomycin Sulfate (500MG  Tablet, 2 (two) Tablet Oral SEE NOTE, Taken starting 05/31/2016) Active. (TAKE TWO TABLETS AT 2 PM, 3 PM, AND 10 PM THE DAY PRIOR TO SURGERY) Flagyl (500MG  Tablet, 2 (two) Tablet Oral SEE NOTE, Taken starting 05/31/2016) Active. (Take at 2pm, 3pm, and 10pm the day prior to your colon operation)  Social History Veronica Ruff, MD; 2/70/3500 10:27 AM) Caffeine use Tea. No drug use Tobacco use Never smoker. Alcohol use Occasional alcohol use.  Family History Veronica Ruff, MD; 9/38/1829 10:27 AM) Alcohol Abuse Father. Diabetes Mellitus Family Members In General. Hypertension Mother. Prostate Cancer Father.  Pregnancy / Birth History Veronica Ruff, MD; 9/37/1696 10:27 AM) Age at menarche 79 years. Age of menopause >79 Gravida 2 Maternal age 84-25 Para 99  Other Problems Veronica Ruff, MD; 7/89/3810 10:27 AM) Diabetes Mellitus Diverticulosis Hemorrhoids     Review of Systems Veronica Ruff MD; 1/75/1025 10:27 AM) General Not Present- Appetite Loss, Chills, Fatigue, Fever, Night Sweats, Weight Gain and Weight Loss. Skin Present- Rash. Not Present- Change in Wart/Mole, Dryness, Hives, Jaundice, New Lesions, Non-Healing Wounds and Ulcer. HEENT Not Present- Earache, Hearing Loss, Hoarseness, Nose Bleed, Oral Ulcers, Ringing in the Ears, Seasonal Allergies, Sinus Pain, Sore Throat, Visual Disturbances, Wears glasses/contact lenses and Yellow Eyes. Respiratory  Present- Snoring. Not Present- Bloody sputum, Chronic Cough, Difficulty Breathing and Wheezing. Breast Not Present- Breast Mass, Breast Pain, Nipple Discharge and Skin Changes. Cardiovascular  Not Present- Chest Pain, Difficulty Breathing Lying Down, Leg Cramps, Palpitations, Rapid Heart Rate, Shortness of Breath and Swelling of Extremities. Gastrointestinal Present- Bloating and Excessive gas. Not Present- Abdominal Pain, Bloody Stool, Change in Bowel Habits, Chronic diarrhea, Constipation, Difficulty Swallowing, Gets full quickly at meals, Hemorrhoids, Indigestion, Nausea, Rectal Pain and Vomiting. Female Genitourinary Present- Frequency and Urgency. Not Present- Nocturia, Painful Urination and Pelvic Pain. Musculoskeletal Not Present- Back Pain, Joint Pain, Joint Stiffness, Muscle Pain, Muscle Weakness and Swelling of Extremities. Neurological Not Present- Decreased Memory, Fainting, Headaches, Numbness, Seizures, Tingling, Tremor, Trouble walking and Weakness. Psychiatric Not Present- Anxiety, Bipolar, Change in Sleep Pattern, Depression, Fearful and Frequent crying. Endocrine Present- Hot flashes. Not Present- Cold Intolerance, Excessive Hunger, Hair Changes, Heat Intolerance and New Diabetes. Hematology Not Present- Blood Thinners, Easy Bruising, Excessive bleeding, Gland problems, HIV and Persistent Infections.  Vitals Malachy Moan RMA; 05/31/2016 9:48 AM) 05/31/2016 9:47 AM Weight: 213.4 lb Height: 63in Body Surface Area: 1.99 m Body Mass Index: 37.8 kg/m  Temp.: 98.46F  Pulse: 82 (Regular)  BP: 142/90 (Sitting, Left Arm, Standard)      Physical Exam Veronica Ruff MD; 10/07/4763 10:28 AM)  The physical exam findings are as follows: Note:General: Alert, obese American female, in no distress Skin: Warm and dry without rash or infection. HEENT: No palpable masses or thyromegaly. Sclera nonicteric. Pupils equal round and reactive. Lymph nodes: No cervical,  supraclavicular, or inguinal nodes palpable. Lungs: Breath sounds clear and equal. No wheezing or increased work of breathing. Cardiovascular: Regular rate and rhythm without murmer. No JVD or edema. Peripheral pulses intact. No carotid bruits. Abdomen: Obese. Well-healed low midline incision. Nondistended. Soft and nontender. No masses palpable. No organomegaly. No palpable hernias. Extremities: No edema or joint swelling or deformity. No chronic venous stasis changes. Neurologic: Alert and fully oriented. Gait normal. No focal weakness. Psychiatric: Normal mood and affect. Thought content appropriate with normal judgement and insight    Assessment & Plan Veronica Ruff MD; 4/65/0354 10:25 AM)  SIGMOID DIVERTICULITIS (K57.32) Impression: 71 year old female with multiple episodes of recurrent diverticulitis. Her last episode was in August 2017. This appears to be at the junction of her descending and sigmoid colon. I recommended that she undergo an elective sigmoidectomy to reduce the chances of recurrence. We have discussed this in detail. All questions were answered. I think she'll most likely need a splenic flexure mobilization. The surgery and anatomy were described to the patient as well as the risks of surgery and the possible complications. These include: Bleeding, deep abdominal infections and possible wound complications such as hernia and infection, damage to adjacent structures, leak of surgical connections, which can lead to other surgeries and possibly an ostomy, possible need for other procedures, such as abscess drains in radiology, possible prolonged hospital stay, possible diarrhea from removal of part of the colon, possible constipation from narcotics, possible bowel, bladder or sexual dysfunction if having rectal surgery, prolonged fatigue/weakness or appetite loss, possible early recurrence of of disease, possible complications of their medical problems such as heart disease or  arrhythmias or lung problems, death (less than 1%). I believe the patient understands and wishes to proceed with the surgery.

## 2016-05-31 NOTE — H&P (Signed)
History of Present Illness Leighton Ruff MD; 09/29/2977 10:27 AM) The patient is a 71 year old female who presents with diverticulitis. She is referred by Dr. Henrene Pastor due to recurrent episodes of sigmoid diverticulitis. She is a 71 year old female originally from [place], recently moved to [place] from [place], [place] to be with her son. She states that for about 10 years she has had multiple recurrent episodes of diverticulitis. She was initially hospitalized for diagnosis. Subsequent episodes have all been treated with outpatient antibiotics. She has not had any specific complications. However she feels the episodes are getting worse and more frequent. She describes onset of left lower quadrant abdominal pain associated with initially diarrhea as well as malaise. This responds to antibiotics. She also describes fecal incontinence with "oozing" of stool which occurs often with the episodes of diverticulitis but sometimes unrelated to diverticulitis particular types of food. This resolves with 1 imodium per day. She has had 2 episodes of diverticulitis since moving to [place]. CT scan of the abdomen and pelvis was obtained on December 07, 2015 showing moderate sigmoid diverticulitis with multiple diverticula and wall thickening and stranding. Hepatic steatosis was noted. Colonoscopy was done in [place] in 2018 showing only diverticulosis. She is on a new med for urinary incontinence that seems to be working well for her.   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 Hysterectomy (not due to cancer) - Partial  Diagnostic Studies History Leighton Ruff, MD; 6/31/4970 10:27 AM) Colonoscopy 1-5 years ago Mammogram 1-3 years ago Pap Smear 1-5 years  ago  Allergies Malachy Moan, RMA; 05/31/2016 9:46 AM) Codeine Phosphate *ANALGESICS - OPIOID*  Medication History Leighton Ruff, MD; 2/63/7858 10:27 AM) Bismuth Subsalicylate (262MG /15ML Suspension, Oral) Active. Ciprofloxacin HCl (500MG  Tablet, Oral) Active. Atorvastatin Calcium (40MG  Tablet, Oral) Active. Venlafaxine HCl ER (75MG  Capsule ER 24HR, Oral) Active. MetFORMIN HCl (500MG  Tablet, Oral) Active. MetroNIDAZOLE (500MG  Tablet, Oral) Active. Medications Reconciled Neomycin Sulfate (500MG  Tablet, 2 (two) Tablet Oral SEE NOTE, Taken starting 05/31/2016) Active. (TAKE TWO TABLETS AT 2 PM, 3 PM, AND 10 PM THE DAY PRIOR TO SURGERY) Flagyl (500MG  Tablet, 2 (two) Tablet Oral SEE NOTE, Taken starting 05/31/2016) Active. (Take at 2pm, 3pm, and 10pm the day prior to your colon operation)  Social History Leighton Ruff, MD; 8/50/2774 10:27 AM) Caffeine use Tea. No drug use Tobacco use Never smoker. Alcohol use Occasional alcohol use.  Family History Leighton Ruff, MD; 05/07/7865 10:27 AM) Alcohol Abuse Father. Diabetes Mellitus Family Members In General. Hypertension Mother. Prostate Cancer Father.  Pregnancy / Birth History Leighton Ruff, MD; 6/72/0947 10:27 AM) Age at menarche 56 years. Age of menopause >76 Gravida 2 Maternal age 74-25 Para 64  Other Problems Leighton Ruff, MD; 0/96/2836 10:27 AM) Diabetes Mellitus Diverticulosis Hemorrhoids     Review of Systems Leighton Ruff MD; 10/07/4763 10:27 AM) General Not Present- Appetite Loss, Chills, Fatigue, Fever, Night Sweats, Weight Gain and Weight Loss. Skin Present- Rash. Not Present- Change in Wart/Mole, Dryness, Hives, Jaundice, New Lesions, Non-Healing Wounds and Ulcer. HEENT Not Present- Earache, Hearing Loss, Hoarseness, Nose Bleed, Oral Ulcers, Ringing in the Ears, Seasonal Allergies, Sinus Pain, Sore Throat, Visual Disturbances, Wears glasses/contact lenses and Yellow Eyes. Respiratory  Present- Snoring. Not Present- Bloody sputum, Chronic Cough, Difficulty Breathing and Wheezing. Breast Not Present- Breast Mass, Breast Pain, Nipple Discharge and Skin Changes. Cardiovascular  Not Present- Chest Pain, Difficulty Breathing Lying Down, Leg Cramps, Palpitations, Rapid Heart Rate, Shortness of Breath and Swelling of Extremities. Gastrointestinal Present- Bloating and Excessive gas. Not Present- Abdominal Pain, Bloody Stool, Change in Bowel Habits, Chronic diarrhea, Constipation, Difficulty Swallowing, Gets full quickly at meals, Hemorrhoids, Indigestion, Nausea, Rectal Pain and Vomiting. Female Genitourinary Present- Frequency and Urgency. Not Present- Nocturia, Painful Urination and Pelvic Pain. Musculoskeletal Not Present- Back Pain, Joint Pain, Joint Stiffness, Muscle Pain, Muscle Weakness and Swelling of Extremities. Neurological Not Present- Decreased Memory, Fainting, Headaches, Numbness, Seizures, Tingling, Tremor, Trouble walking and Weakness. Psychiatric Not Present- Anxiety, Bipolar, Change in Sleep Pattern, Depression, Fearful and Frequent crying. Endocrine Present- Hot flashes. Not Present- Cold Intolerance, Excessive Hunger, Hair Changes, Heat Intolerance and New Diabetes. Hematology Not Present- Blood Thinners, Easy Bruising, Excessive bleeding, Gland problems, HIV and Persistent Infections.  Vitals Malachy Moan RMA; 05/31/2016 9:48 AM) 05/31/2016 9:47 AM Weight: 213.4 lb Height: 63in Body Surface Area: 1.99 m Body Mass Index: 37.8 kg/m  Temp.: 98.66F  Pulse: 82 (Regular)  BP: 142/90 (Sitting, Left Arm, Standard)      Physical Exam Leighton Ruff MD; 3/88/8280 10:28 AM)  The physical exam findings are as follows: Note:General: Alert, obese American female, in no distress Skin: Warm and dry without rash or infection. HEENT: No palpable masses or thyromegaly. Sclera nonicteric. Pupils equal round and reactive. Lymph nodes: No cervical,  supraclavicular, or inguinal nodes palpable. Lungs: Breath sounds clear and equal. No wheezing or increased work of breathing. Cardiovascular: Regular rate and rhythm without murmer. No JVD or edema. Peripheral pulses intact. No carotid bruits. Abdomen: Obese. Well-healed low midline incision. Nondistended. Soft and nontender. No masses palpable. No organomegaly. No palpable hernias. Extremities: No edema or joint swelling or deformity. No chronic venous stasis changes. Neurologic: Alert and fully oriented. Gait normal. No focal weakness. Psychiatric: Normal mood and affect. Thought content appropriate with normal judgement and insight    Assessment & Plan Leighton Ruff MD; 0/34/9179 10:25 AM)  SIGMOID DIVERTICULITIS (K57.32) Impression: 71 year old female with multiple episodes of recurrent diverticulitis. Her last episode was in August 2017. This appears to be at the junction of her descending and sigmoid colon. I recommended that she undergo an elective sigmoidectomy to reduce the chances of recurrence. We have discussed this in detail. All questions were answered. I think she'll most likely need a splenic flexure mobilization. The surgery and anatomy were described to the patient as well as the risks of surgery and the possible complications. These include: Bleeding, deep abdominal infections and possible wound complications such as hernia and infection, damage to adjacent structures, leak of surgical connections, which can lead to other surgeries and possibly an ostomy, possible need for other procedures, such as abscess drains in radiology, possible prolonged hospital stay, possible diarrhea from removal of part of the colon, possible constipation from narcotics, possible bowel, bladder or sexual dysfunction if having rectal surgery, prolonged fatigue/weakness or appetite loss, possible early recurrence of of disease, possible complications of their medical problems such as heart disease or  arrhythmias or lung problems, death (less than 1%). I believe the patient understands and wishes to proceed with the surgery.

## 2016-06-28 ENCOUNTER — Telehealth: Payer: Self-pay | Admitting: Internal Medicine

## 2016-06-28 NOTE — Telephone Encounter (Signed)
Called patient to schedule awv. Lvm for patient to call office to schedule appt.  °

## 2016-06-29 DIAGNOSIS — N3946 Mixed incontinence: Secondary | ICD-10-CM | POA: Diagnosis not present

## 2016-06-29 DIAGNOSIS — N3944 Nocturnal enuresis: Secondary | ICD-10-CM | POA: Diagnosis not present

## 2016-08-09 ENCOUNTER — Encounter (HOSPITAL_COMMUNITY): Admission: RE | Admit: 2016-08-09 | Payer: Medicare Other | Source: Ambulatory Visit

## 2016-08-17 NOTE — Patient Instructions (Signed)
Veronica Carrillo  08/17/2016   Your procedure is scheduled on: 08/23/2016    Report to [facility] Main  Entrance Take [place]  elevators to 3rd floor to  [facility] at    [phone].    Call this number if you have problems the morning of surgery [phone]    Remember: ONLY 1 PERSON MAY GO WITH YOU TO SHORT STAY TO GET  READY MORNING OF YOUR SURGERY.  Do not eat food or drink liquids :After Midnight.         Drink plenty of clear liquids on day of bowel prep to prevent dehydration .      Take these medicines the morning of surgery with A SIP OF WATER: Effexor  DO NOT TAKE ANY DIABETIC MEDICATIONS DAY OF YOUR SURGERY                               You may not have any metal on your body including hair pins and              piercings  Do not wear jewelry, make-up, lotions, powders or perfumes, deodorant             Do not wear nail polish.  Do not shave  48 hours prior to surgery.             Do not bring valuables to the hospital. [place].  Contacts, dentures or bridgework may not be worn into surgery.  Leave suitcase in the car. After surgery it may be brought to your room.     How to Manage Your Diabetes Before and After Surgery  Why is it important to control my blood sugar before and after surgery? . Improving blood sugar levels before and after surgery helps healing and can limit problems. . A way of improving blood sugar control is eating a healthy diet by: o  Eating less sugar and carbohydrates o  Increasing activity/exercise o  Talking with your doctor about reaching your blood sugar goals . High blood sugars (greater than 180 mg/dL) can raise your risk of infections and slow your recovery, so you will need to focus on controlling your diabetes during the weeks before surgery. . Make sure that the doctor who takes care of your diabetes knows about your planned surgery including the date and  location.  How do I manage my blood sugar before surgery? . Check your blood sugar at least 4 times a day, starting 2 days before surgery, to make sure that the level is not too high or low. o Check your blood sugar the morning of your surgery when you wake up and every 2 hours until you get to the Short Stay unit. . If your blood sugar is less than 70 mg/dL, you will need to treat for low blood sugar: o Do not take insulin. o Treat a low blood sugar (less than 70 mg/dL) with  cup of clear juice (cranberry or apple), 4 glucose tablets, OR glucose gel. o Recheck blood sugar in 15 minutes after treatment (to make sure it is greater than 70 mg/dL). If your blood sugar is not greater than 70 mg/dL on recheck, call [phone] for further instructions. . Report your  blood sugar to the short stay nurse when you get to Short Stay.  . If you are admitted to the hospital after surgery: o Your blood sugar will be checked by the staff and you will probably be given insulin after surgery (instead of oral diabetes medicines) to make sure you have good blood sugar levels. o The goal for blood sugar control after surgery is 80-180 mg/dL.   WHAT DO I DO ABOUT MY DIABETES MEDICATION?  Marland Kitchen Do not take oral diabetes medicines (pills) the morning of surgery.   .   .   .  .      Patient Signature:  Date:   Nurse Signature:  Date:   Reviewed and Endorsed by [facility] Patient Education Committee, August 2015              Please read over the following fact sheets you were given: _____________________________________________________________________                CLEAR LIQUID DIET   Foods Allowed                                                                     Foods Excluded  Coffee and tea, regular and decaf                             liquids that you cannot  Plain Jell-O in any flavor                                             see through such as: Fruit ices (not with fruit pulp)                                      milk, soups, orange juice  Iced Popsicles                                    All solid food Carbonated beverages, regular and diet                                    Cranberry, grape and apple juices Sports drinks like Gatorade Lightly seasoned clear broth or consume(fat free) Sugar, honey syrup  Sample Menu Breakfast                                Lunch                                     Supper Cranberry juice                    Beef broth  Chicken broth Jell-O                                     Grape juice                           Apple juice Coffee or tea                        Jell-O                                      Popsicle                                                Coffee or tea                        Coffee or tea  _____________________________________________________________________  [facility] - Preparing for Surgery Before surgery, you can play an important role.  Because skin is not sterile, your skin needs to be as free of germs as possible.  You can reduce the number of germs on your skin by washing with CHG (chlorahexidine gluconate) soap before surgery.  CHG is an antiseptic cleaner which kills germs and bonds with the skin to continue killing germs even after washing. Please DO NOT use if you have an allergy to CHG or antibacterial soaps.  If your skin becomes reddened/irritated stop using the CHG and inform your nurse when you arrive at Short Stay. Do not shave (including legs and underarms) for at least 48 hours prior to the first CHG shower.  You may shave your face/neck. Please follow these instructions carefully:  1.  Shower with CHG Soap the night before surgery and the  morning of Surgery.  2.  If you choose to wash your hair, wash your hair first as usual with your  normal  shampoo.  3.  After you shampoo, rinse your hair and body thoroughly to remove the  shampoo.                           4.   Use CHG as you would any other liquid soap.  You can apply chg directly  to the skin and wash                       Gently with a scrungie or clean washcloth.  5.  Apply the CHG Soap to your body ONLY FROM THE NECK DOWN.   Do not use on face/ open                           Wound or open sores. Avoid contact with eyes, ears mouth and genitals (private parts).                       Wash face,  Genitals (private parts) with your normal soap.             6.  Wash thoroughly, paying special attention to the area  where your surgery  will be performed.  7.  Thoroughly rinse your body with warm water from the neck down.  8.  DO NOT shower/wash with your normal soap after using and rinsing off  the CHG Soap.                9.  Pat yourself dry with a clean towel.            10.  Wear clean pajamas.            11.  Place clean sheets on your bed the night of your first shower and do not  sleep with pets. Day of Surgery : Do not apply any lotions/deodorants the morning of surgery.  Please wear clean clothes to the hospital/surgery center.  FAILURE TO FOLLOW THESE INSTRUCTIONS MAY RESULT IN THE CANCELLATION OF YOUR SURGERY PATIENT SIGNATURE_________________________________  NURSE SIGNATURE__________________________________  ________________________________________________________________________

## 2016-08-18 ENCOUNTER — Encounter (HOSPITAL_COMMUNITY)
Admission: RE | Admit: 2016-08-18 | Discharge: 2016-08-18 | Disposition: A | Discharge status: Home / Self Care | Payer: Medicare Other | Source: Ambulatory Visit | Attending: General Surgery | Admitting: General Surgery

## 2016-08-23 ENCOUNTER — Inpatient Hospital Stay (HOSPITAL_COMMUNITY): Admission: RE | Admit: 2016-08-23 | Payer: Medicare Other | Source: Ambulatory Visit | Admitting: General Surgery

## 2016-08-23 ENCOUNTER — Encounter (HOSPITAL_COMMUNITY): Admission: RE | Payer: Self-pay | Source: Ambulatory Visit

## 2016-08-23 SURGERY — COLECTOMY, PARTIAL, ROBOT-ASSISTED, LAPAROSCOPIC
Anesthesia: General

## 2016-09-23 NOTE — Progress Notes (Signed)
Subjective:    Patient ID: Veronica Carrillo, female    DOB: 09-28-1945, 71 y.o.   MRN: [id number]  HPI Here for medicare wellness exam and follow up of her chronic medical problems.   I have personally reviewed and have noted 1.The patient's medical and social history 2.Their use of alcohol, tobacco or illicit drugs 3.Their current medications and supplements 4.The patient's functional ability including ADL's, fall risks, home safety risks and hearing or visual impairment. 5.Diet and physical activities 6.Evidence for depression or mood disorders 7.Care team reviewed  - urology - ? name   She is going on a cruise and wants to have patches for motion sickness.    Diabetes: She is taking her medication daily as prescribed. She is compliant with a diabetic diet. She is exercising regularly - walks every morning - increasing the distance and working on weight loss.    Depression, anxiety: She is taking her medication daily as prescribed. She denies any side effects from the medication. She feels her depression and anxiety are well controlled and she is happy with her current dose of medication.   Hyperlipidemia: She is taking her medication daily. She is compliant with a low fat/cholesterol diet. She is exercising regularly. She denies myalgias.    Are there smokers in your home (other than you)? No  Risk Factors Exercise: walking daily.   Dietary issues discussed: well-balanced, lots of veges, fruits, avoids sweets and carbs  Cardiac risk factors: advanced age, hypertension, hyperlipidemia, and obesity (BMI >= 30 kg/m2).  Depression Screen  Have you felt down, depressed or hopeless? No  Have you felt little interest or pleasure in doing things?  No  Activities of Daily Living In your present state of health, do you have any difficulty performing the following activities?:  Driving? No Managing money?  No Feeding  yourself? No Getting from bed to chair? No Climbing a flight of stairs? No Preparing food and eating?: No Bathing or showering? No Getting dressed: No Getting to/using the toilet? No Moving around from place to place: No In the past year have you fallen or had a near fall?: No   Are you sexually active?  No  Do you have more than one partner?  N/A  Hearing Difficulties: No Do you often ask people to speak up or repeat themselves? No Do you experience ringing or noises in your ears? No Do you have difficulty understanding soft or whispered voices? No Vision:              Any change in vision:  no             Up to date with eye exam: no - will schedule Memory:  Do you feel that you have a problem with memory? No  Do you often misplace items? No  Do you feel safe at home?  Yes  Cognitive Testing  Alert, Orientated? Yes  Normal Appearance? Yes  Recall of three objects?  Yes  Can perform simple calculations? Yes  Displays appropriate judgment? Yes  Can read the correct time from a watch face? Yes   Advanced Directives have been discussed with the patient? Yes     Medications and allergies reviewed with patient and updated if appropriate.  Patient Active Problem List   Diagnosis Date Noted  . Hyperlipidemia 10/01/2015  . Diverticulitis of colon 10/01/2015  . Diabetes ([place]) 10/01/2015  . Depression 10/01/2015  . Anxiety 10/01/2015  . Arthritis of right knee 10/01/2015  Current Outpatient Prescriptions on File Prior to Visit  Medication Sig Dispense Refill  . fesoterodine (TOVIAZ) 4 MG TB24 tablet Take 4 mg by mouth daily.    Marland Kitchen loperamide (IMODIUM) 2 MG capsule Take 2-4 mg by mouth 3 (three) times daily as needed for diarrhea or loose stools.    . metFORMIN (GLUCOPHAGE) 500 MG tablet Take 1 tablet (500 mg total) by mouth daily with breakfast. (Patient taking differently: Take 500 mg by mouth daily. ) 90 tablet 2  . rosuvastatin (CRESTOR) 40 MG tablet Take 40 mg by  mouth daily.  2  . venlafaxine XR (EFFEXOR-XR) 75 MG 24 hr capsule Take 1 capsule (75 mg total) by mouth daily with breakfast. (Patient taking differently: Take 75 mg by mouth daily. ) 90 capsule 2   No current facility-administered medications on file prior to visit.     Past Medical History:  Diagnosis Date  . Depression   . Diabetes mellitus without complication ([place])   . Diverticulitis   . History of colon polyps   . Hyperlipidemia   . IBS (irritable bowel syndrome)     Past Surgical History:  Procedure Laterality Date  . ABDOMINAL EXPLORATION SURGERY    . ABDOMINAL HYSTERECTOMY    . DILATION AND CURETTAGE OF UTERUS     x 3  . TONSILLECTOMY      Social History   Social History  . Marital status: Single    Spouse name: N/A  . Number of children: 1  . Years of education: N/A   Occupational History  . retired   . PT recepionist    Social History Main Topics  . Smoking status: Never Smoker  . Smokeless tobacco: Never Used  . Alcohol use 0.0 oz/week     Comment: wine cooler-ocassional  . Drug use: No  . Sexual activity: Not Asked   Other Topics Concern  . None   Social History Narrative   Exercise: [employer], walking      Moved from [place]    Family History  Problem Relation Age of Onset  . Hypertension Mother   . Bladder Cancer Father   . Heart disease Father   . Diabetes Paternal Grandmother     Review of Systems  Constitutional: Negative for chills and fever.  HENT: Negative for hearing loss and tinnitus.   Eyes: Negative for visual disturbance.  Respiratory: Negative for cough, shortness of breath and wheezing.   Cardiovascular: Negative for chest pain, palpitations and leg swelling.  Gastrointestinal: Negative for abdominal pain and nausea.  Neurological: Positive for headaches (occ). Negative for light-headedness.  Psychiatric/Behavioral: Negative for dysphoric mood. The patient is not nervous/anxious.        Objective:   Vitals:   09/25/16  1418  BP: 140/84  Pulse: (!) 103  Resp: 16  Temp: 98.6 F (37 C)   Wt Readings from Last 3 Encounters:  09/25/16 217 lb (98.4 kg)  01/05/16 215 lb 2 oz (97.6 kg)  12/28/15 217 lb 0.6 oz (98.4 kg)   Body mass index is 39.69 kg/m.   Physical Exam    Constitutional: Appears well-developed and well-nourished. No distress.  HENT:  Head: Normocephalic and atraumatic.  Neck: Neck supple. No tracheal deviation present. No thyromegaly present.  No cervical lymphadenopathy Cardiovascular: Normal rate, regular rhythm and normal heart sounds.   No murmur heard. No carotid bruit .  No edema Pulmonary/Chest: Effort normal and breath sounds normal. No respiratory distress. No has no wheezes. No rales.  Skin:  Skin is warm and dry. Not diaphoretic.  Psychiatric: Normal mood and affect. Behavior is normal.   Diabetic Foot Exam - Simple   Simple Foot Form Diabetic Foot exam was performed with the following findings:  Yes 09/25/2016  3:07 PM  Visual Inspection No deformities, no ulcerations, no other skin breakdown bilaterally:  Yes Sensation Testing Intact to touch and monofilament testing bilaterally:  Yes Pulse Check Posterior Tibialis and Dorsalis pulse intact bilaterally:  Yes Comments       Assessment & Plan:   Wellness Exam: Immunizations  prevnar today, td and shingrix discussed Colonoscopy   Up to date - due this year Mammogram  - ordered Dexa  Ordered - - never had one Eye exam  - due - will schedule Hearing loss  none Memory concerns/difficulties   none Independent of ADLs  fully Stressed the importance of regular exercise   Patient received copy of preventative screening tests/immunizations recommended for the next 5-10 years.  See Problem List for Assessment and Plan of chronic medical problems.

## 2016-09-25 ENCOUNTER — Encounter: Payer: Self-pay | Admitting: Internal Medicine

## 2016-09-25 ENCOUNTER — Other Ambulatory Visit (INDEPENDENT_AMBULATORY_CARE_PROVIDER_SITE_OTHER): Payer: Medicare Other

## 2016-09-25 ENCOUNTER — Ambulatory Visit (INDEPENDENT_AMBULATORY_CARE_PROVIDER_SITE_OTHER): Payer: Medicare Other | Admitting: Internal Medicine

## 2016-09-25 VITALS — BP 140/84 | HR 103 | Temp 98.6°F | Resp 16 | Wt 217.0 lb

## 2016-09-25 DIAGNOSIS — Z1159 Encounter for screening for other viral diseases: Secondary | ICD-10-CM

## 2016-09-25 DIAGNOSIS — Z13820 Encounter for screening for osteoporosis: Secondary | ICD-10-CM

## 2016-09-25 DIAGNOSIS — Z23 Encounter for immunization: Secondary | ICD-10-CM | POA: Diagnosis not present

## 2016-09-25 DIAGNOSIS — E119 Type 2 diabetes mellitus without complications: Secondary | ICD-10-CM

## 2016-09-25 DIAGNOSIS — F419 Anxiety disorder, unspecified: Secondary | ICD-10-CM

## 2016-09-25 DIAGNOSIS — F329 Major depressive disorder, single episode, unspecified: Secondary | ICD-10-CM

## 2016-09-25 DIAGNOSIS — E7800 Pure hypercholesterolemia, unspecified: Secondary | ICD-10-CM

## 2016-09-25 DIAGNOSIS — F32A Depression, unspecified: Secondary | ICD-10-CM

## 2016-09-25 DIAGNOSIS — E2839 Other primary ovarian failure: Secondary | ICD-10-CM | POA: Diagnosis not present

## 2016-09-25 DIAGNOSIS — Z0000 Encounter for general adult medical examination without abnormal findings: Secondary | ICD-10-CM

## 2016-09-25 DIAGNOSIS — Z1231 Encounter for screening mammogram for malignant neoplasm of breast: Secondary | ICD-10-CM | POA: Diagnosis not present

## 2016-09-25 LAB — CBC WITH DIFFERENTIAL/PLATELET
BASOS PCT: 1.4 % (ref 0.0–3.0)
Basophils Absolute: 0.1 10*3/uL (ref 0.0–0.1)
EOS ABS: 0.3 10*3/uL (ref 0.0–0.7)
Eosinophils Relative: 4.3 % (ref 0.0–5.0)
HCT: 40.1 % (ref 36.0–46.0)
HEMOGLOBIN: 13.6 g/dL (ref 12.0–15.0)
Lymphocytes Relative: 39.6 % (ref 12.0–46.0)
Lymphs Abs: 2.5 10*3/uL (ref 0.7–4.0)
MCHC: 33.9 g/dL (ref 30.0–36.0)
MCV: 84.4 fl (ref 78.0–100.0)
MONO ABS: 0.5 10*3/uL (ref 0.1–1.0)
Monocytes Relative: 7.7 % (ref 3.0–12.0)
Neutro Abs: 3 10*3/uL (ref 1.4–7.7)
Neutrophils Relative %: 47 % (ref 43.0–77.0)
Platelets: 290 10*3/uL (ref 150.0–400.0)
RBC: 4.75 Mil/uL (ref 3.87–5.11)
RDW: 13.7 % (ref 11.5–15.5)
WBC: 6.3 10*3/uL (ref 4.0–10.5)

## 2016-09-25 LAB — COMPREHENSIVE METABOLIC PANEL
ALK PHOS: 134 U/L — AB (ref 39–117)
ALT: 19 U/L (ref 0–35)
AST: 23 U/L (ref 0–37)
Albumin: 4.2 g/dL (ref 3.5–5.2)
BILIRUBIN TOTAL: 0.7 mg/dL (ref 0.2–1.2)
BUN: 11 mg/dL (ref 6–23)
CO2: 31 meq/L (ref 19–32)
Calcium: 9.5 mg/dL (ref 8.4–10.5)
Chloride: 105 mEq/L (ref 96–112)
Creatinine, Ser: 1.01 mg/dL (ref 0.40–1.20)
GFR: 69.67 mL/min (ref >60.00)
GLUCOSE: 164 mg/dL — AB (ref 70–99)
POTASSIUM: 4.3 meq/L (ref 3.5–5.1)
SODIUM: 139 meq/L (ref 135–145)
TOTAL PROTEIN: 7.3 g/dL (ref 6.0–8.3)

## 2016-09-25 LAB — LIPID PANEL
Cholesterol: 171 mg/dL (ref 0–200)
HDL: 45 mg/dL (ref >39.00)
NONHDL: 125.75
Total CHOL/HDL Ratio: 4
Triglycerides: 267 mg/dL — ABNORMAL HIGH (ref 0.0–149.0)
VLDL: 53.4 mg/dL — AB (ref 0.0–40.0)

## 2016-09-25 LAB — LDL CHOLESTEROL, DIRECT: LDL DIRECT: 92 mg/dL

## 2016-09-25 LAB — MICROALBUMIN / CREATININE URINE RATIO
CREATININE, U: 166.8 mg/dL
MICROALB UR: 12.6 mg/dL — AB (ref 0.0–1.9)
Microalb Creat Ratio: 7.6 mg/g (ref 0.0–30.0)

## 2016-09-25 LAB — HEMOGLOBIN A1C: HEMOGLOBIN A1C: 7.1 % — AB (ref 4.6–6.5)

## 2016-09-25 MED ORDER — SCOPOLAMINE 1 MG/3DAYS TD PT72: 1.0000 | MEDICATED_PATCH | TRANSDERMAL | 0 refills | Status: DC

## 2016-09-25 NOTE — Assessment & Plan Note (Signed)
Controlled, stable Continue current dose of medication  

## 2016-09-25 NOTE — Assessment & Plan Note (Signed)
Check lipid panel  Continue daily statin Regular exercise and healthy diet encouraged  

## 2016-09-25 NOTE — Assessment & Plan Note (Addendum)
Check a1c Low sugar / carb diet Stressed regular exercise,weight loss Continue metformin - will adjust if needed

## 2016-09-25 NOTE — Patient Instructions (Addendum)
  Veronica Carrillo , Thank you for taking time to come for your Medicare Wellness Visit. I appreciate your ongoing commitment to your health goals. Please review the following plan we discussed and let me know if I can assist you in the future.   These are the goals we discussed: Goals    Continue to work on weight loss      This is a list of the screening recommended for you and due dates:  Health Maintenance  Topic Date Due  .  Hepatitis C: One time screening is recommended by Center for Disease Control  (CDC) for  adults born from [birth year] through 1964.   05-15-1945  . Eye exam for diabetics  08/19/1956  . Tetanus Vaccine  08/19/1965  . Mammogram  08/19/1996  . DEXA scan (bone density measurement)  08/20/2011  . Pneumonia vaccines (1 of 2 - PCV13) 08/20/2011  . Hemoglobin A1C  04/01/2016  . Urine Protein Check  09/30/2016  . Flu Shot  11/08/2016  . Colon Cancer Screening  01/15/2017  . Complete foot exam   09/25/2017    Test(s) ordered today. Your results will be released to [place] (or called to you) after review, usually within 72hours after test completion. If any changes need to be made, you will be notified at that same time.  All other Health Maintenance issues reviewed.   All recommended immunizations and age-appropriate screenings are up-to-date or discussed.  Prevnar immunization administered today.   Medications reviewed and updated.  Changes include scoploamine for your cruise.  Your prescription(s) have been submitted to your pharmacy. Please take as directed and contact our office if you believe you are having problem(s) with the medication(s).   Please followup in 6 months

## 2016-09-26 LAB — HEPATITIS C ANTIBODY: HCV Ab: NEGATIVE

## 2016-10-20 ENCOUNTER — Telehealth: Payer: Self-pay | Admitting: Internal Medicine

## 2016-10-20 MED ORDER — CIPROFLOXACIN HCL 500 MG PO TABS: 500.0000 mg | ORAL_TABLET | Freq: Two times a day (BID) | ORAL | 0 refills | Status: DC

## 2016-10-20 MED ORDER — METRONIDAZOLE 500 MG PO TABS: 500.0000 mg | ORAL_TABLET | Freq: Three times a day (TID) | ORAL | 0 refills | Status: DC

## 2016-10-20 MED ORDER — SACCHAROMYCES BOULARDII 250 MG PO CAPS: 250.0000 mg | ORAL_CAPSULE | Freq: Two times a day (BID) | ORAL | 0 refills | Status: DC

## 2016-10-20 NOTE — Telephone Encounter (Signed)
Pt states she is having a flare up of diverticulitis,  She would like a refill of saccharomyces boulardii (FLORASTOR) 250 MG capsule  And metroNIDAZOLE (FLAGYL) 500 MG tablet   [facility] on [address]

## 2016-10-20 NOTE — Telephone Encounter (Signed)
Please advise 

## 2016-10-20 NOTE — Telephone Encounter (Signed)
Ok - sent flagyl and cipro for 10 days - should take both.  florastor also sent   F/u if no improvement - clinic open tomorrow

## 2016-10-20 NOTE — Telephone Encounter (Signed)
Spoke with pt to inform.  

## 2016-10-31 ENCOUNTER — Telehealth: Payer: Self-pay | Admitting: Internal Medicine

## 2016-10-31 MED ORDER — VENLAFAXINE HCL ER 75 MG PO CP24: 75.0000 mg | ORAL_CAPSULE | Freq: Every day | ORAL | 0 refills | Status: DC

## 2016-10-31 MED ORDER — METFORMIN HCL 500 MG PO TABS: 500.0000 mg | ORAL_TABLET | Freq: Every day | ORAL | 0 refills | Status: DC

## 2016-10-31 MED ORDER — ROSUVASTATIN CALCIUM 40 MG PO TABS: 40.0000 mg | ORAL_TABLET | Freq: Every day | ORAL | 0 refills | Status: DC

## 2016-10-31 NOTE — Telephone Encounter (Signed)
Pt is in [place] and would like a 6 day supply of  rosuvastatin (CRESTOR) 40 MG  And  venlafaxine XR (EFFEXOR-XR) 75 MG 24 hr capsule  And  metFORMIN (GLUCOPHAGE) 500 MG tablet   [facility] on [address] in [place]  [phone]

## 2016-10-31 NOTE — Telephone Encounter (Signed)
Refills sent

## 2016-11-27 ENCOUNTER — Other Ambulatory Visit: Payer: Self-pay | Admitting: Internal Medicine

## 2016-11-27 DIAGNOSIS — Z1231 Encounter for screening mammogram for malignant neoplasm of breast: Secondary | ICD-10-CM

## 2016-11-28 ENCOUNTER — Ambulatory Visit (INDEPENDENT_AMBULATORY_CARE_PROVIDER_SITE_OTHER)
Admission: RE | Admit: 2016-11-28 | Discharge: 2016-11-28 | Disposition: A | Discharge status: Home / Self Care | Payer: Medicare Other | Source: Ambulatory Visit | Attending: Internal Medicine | Admitting: Internal Medicine

## 2016-11-28 ENCOUNTER — Encounter: Payer: Self-pay | Admitting: Internal Medicine

## 2016-11-28 ENCOUNTER — Ambulatory Visit (INDEPENDENT_AMBULATORY_CARE_PROVIDER_SITE_OTHER): Payer: Medicare Other | Admitting: Internal Medicine

## 2016-11-28 VITALS — BP 154/82 | HR 95 | Temp 98.2°F | Resp 16 | Wt 213.0 lb

## 2016-11-28 DIAGNOSIS — M1711 Unilateral primary osteoarthritis, right knee: Secondary | ICD-10-CM | POA: Diagnosis not present

## 2016-11-28 DIAGNOSIS — M25561 Pain in right knee: Secondary | ICD-10-CM

## 2016-11-28 NOTE — Progress Notes (Signed)
Subjective:    Patient ID: Veronica Carrillo, female    DOB: April 17, 1945, 71 y.o.   MRN: [id number]  HPI She is here for an acute visit.   Right knee pain:  A couple of weeks ago she was sitting at her desk and got up and had sudden pain throughout her right knee.  She thinks there was a little swelling, but no bruising.   It has gotten better.  The knee feels stiff. When she walks she has pain, but better than sitting and letting it stiffen up.  She denies calf swelling.  She has iced it and applied topical numbing medication.  She took otc pain medication and it did not help.    Prior to his the knee felt good - no pain or stiffness.    Medications and allergies reviewed with patient and updated if appropriate.  Patient Active Problem List   Diagnosis Date Noted  . Hyperlipidemia 10/01/2015  . Diverticulitis of colon 10/01/2015  . Diabetes ([place]) 10/01/2015  . Depression 10/01/2015  . Anxiety 10/01/2015  . Arthritis of right knee 10/01/2015    Current Outpatient Prescriptions on File Prior to Visit  Medication Sig Dispense Refill  . fesoterodine (TOVIAZ) 4 MG TB24 tablet Take 4 mg by mouth daily.    Marland Kitchen loperamide (IMODIUM) 2 MG capsule Take 2-4 mg by mouth 3 (three) times daily as needed for diarrhea or loose stools.    . metFORMIN (GLUCOPHAGE) 500 MG tablet Take 1 tablet (500 mg total) by mouth daily with breakfast. 6 tablet 0  . rosuvastatin (CRESTOR) 40 MG tablet Take 1 tablet (40 mg total) by mouth daily. 6 tablet 0  . saccharomyces boulardii (FLORASTOR) 250 MG capsule Take 1 capsule (250 mg total) by mouth 2 (two) times daily. 60 capsule 0  . venlafaxine XR (EFFEXOR-XR) 75 MG 24 hr capsule Take 1 capsule (75 mg total) by mouth daily. 6 capsule 0   No current facility-administered medications on file prior to visit.     Past Medical History:  Diagnosis Date  . Depression   . Diabetes mellitus without complication ([place])   . Diverticulitis   . History of colon polyps   .  Hyperlipidemia   . IBS (irritable bowel syndrome)     Past Surgical History:  Procedure Laterality Date  . ABDOMINAL EXPLORATION SURGERY    . ABDOMINAL HYSTERECTOMY    . DILATION AND CURETTAGE OF UTERUS     x 3  . TONSILLECTOMY      Social History   Social History  . Marital status: Single    Spouse name: N/A  . Number of children: 1  . Years of education: N/A   Occupational History  . retired   . PT recepionist    Social History Main Topics  . Smoking status: Never Smoker  . Smokeless tobacco: Never Used  . Alcohol use 0.0 oz/week     Comment: wine cooler-ocassional  . Drug use: No  . Sexual activity: Not on file   Other Topics Concern  . Not on file   Social History Narrative   Exercise: [employer], walking      Moved from [place]    Family History  Problem Relation Age of Onset  . Hypertension Mother   . Bladder Cancer Father   . Heart disease Father   . Diabetes Paternal Grandmother     Review of Systems  Constitutional: Negative for chills and fever.  Musculoskeletal: Positive for arthralgias and joint  swelling.  Skin: Negative for color change and rash.       Objective:   Vitals:   11/28/16 1044  BP: (!) 154/82  Pulse: 95  Resp: 16  Temp: 98.2 F (36.8 C)  SpO2: 97%   Filed Weights   11/28/16 1044  Weight: 213 lb (96.6 kg)   Body mass index is 38.96 kg/m.  Wt Readings from Last 3 Encounters:  11/28/16 213 lb (96.6 kg)  09/25/16 217 lb (98.4 kg)  01/05/16 215 lb 2 oz (97.6 kg)     Physical Exam A right knee exam was performed.   SKIN: intact, no bruising or change in skin color  SWELLING: mild  EFFUSION: no  WARMTH: no warmth  TENDERNESS: mild tenderness posterior aspect of knee and upper lateral calf no tenderness with palpation of lateral, anterior or medial knee   ROM: minimally decreased extension, full flexion  GAIT: walks with a limp  - mild NEUROLOGICAL EXAM: normal sensation  CALF TENDERNESS: minimal in upper lateral  calf         Assessment & Plan:   See Problem List for Assessment and Plan of chronic medical problems.

## 2016-11-28 NOTE — Assessment & Plan Note (Signed)
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

## 2016-11-28 NOTE — Patient Instructions (Signed)
Have an x-ray today.  Take advil or tylenol and ice the knee.  If the pain does not continue to improve schedule an appointment with our sports medicine doctor.      Baker Cyst A Baker cyst, also called a popliteal cyst, is a sac-like growth that forms at the back of the knee. The cyst forms when the fluid-filled sac (bursa) that cushions the knee joint becomes enlarged. The bursa that becomes a Baker cyst is located at the back of the knee joint. What are the causes? In most cases, a Baker cyst results from another knee problem that causes swelling inside the knee. This makes the fluid inside the knee joint (synovial fluid) flow into the bursa behind the knee, causing the bursa to enlarge. What increases the risk? You may be more likely to develop a Baker cyst if you already have a knee problem, such as:  A tear in cartilage that cushions the knee joint (meniscal tear).  A tear in the tissues that connect the bones of the knee joint (ligament tear).  Knee swelling from osteoarthritis, rheumatoid arthritis, or gout.  What are the signs or symptoms? A Baker cyst does not always cause symptoms. A lump behind the knee may be the only sign of the condition. The lump may be painful, especially when the knee is straightened. If the lump is painful, the pain may come and go. The knee may also be stiff. Symptoms may quickly get more severe if the cyst breaks open (ruptures). If your cyst ruptures, signs and symptoms may affect the knee and the back of the lower leg (calf) and may include:  Sudden or worsening pain.  Swelling.  Bruising.  How is this diagnosed? This condition may be diagnosed based on your symptoms and medical history. Your health care provider will also do a physical exam. This may include:  Feeling the cyst to check whether it is tender.  Checking your knee for signs of another knee condition that causes swelling.  You may have imaging tests, such  as:  X-rays.  MRI.  Ultrasound.  How is this treated? A Baker cyst that is not painful may go away without treatment. If the cyst gets large or painful, it will likely get better if the underlying knee problem is treated. Treatment for a Baker cyst may include:  Resting.  Keeping weight off of the knee. This means not leaning on the knee to support your body weight.  NSAIDs to reduce pain and swelling.  A procedure to drain the fluid from the cyst with a needle (aspiration). You may also get an injection of a medicine that reduces swelling (steroid).  Surgery. This may be needed if other treatments do not work. This usually involves correcting knee damage and removing the cyst.  Follow these instructions at home:  Take over-the-counter and prescription medicines only as told by your health care provider.  Rest and return to your normal activities as told by your health care provider. Avoid activities that make pain or swelling worse. Ask your health care provider what activities are safe for you.  Keep all follow-up visits as told by your health care provider. This is important. Contact a health care provider if:  You have knee pain, stiffness, or swelling that does not get better. Get help right away if:  You have sudden or worsening pain and swelling in your calf area. This information is not intended to replace advice given to you by your health care provider.  Make sure you discuss any questions you have with your health care provider. Document Released: 03/27/2005 Document Revised: 12/16/2015 Document Reviewed: 12/16/2015 Elsevier Interactive Patient Education  2018 [employer].

## 2016-12-19 ENCOUNTER — Ambulatory Visit
Admission: RE | Admit: 2016-12-19 | Discharge: 2016-12-19 | Disposition: A | Discharge status: Home / Self Care | Payer: Medicare Other | Source: Ambulatory Visit | Attending: Internal Medicine | Admitting: Internal Medicine

## 2016-12-19 DIAGNOSIS — E2839 Other primary ovarian failure: Secondary | ICD-10-CM

## 2016-12-19 DIAGNOSIS — Z1231 Encounter for screening mammogram for malignant neoplasm of breast: Secondary | ICD-10-CM | POA: Diagnosis not present

## 2016-12-19 DIAGNOSIS — Z13820 Encounter for screening for osteoporosis: Secondary | ICD-10-CM | POA: Diagnosis not present

## 2016-12-19 DIAGNOSIS — Z780 Asymptomatic menopausal state: Secondary | ICD-10-CM | POA: Diagnosis not present

## 2017-03-05 ENCOUNTER — Ambulatory Visit: Payer: Medicare Other | Admitting: Podiatry

## 2017-03-09 ENCOUNTER — Encounter: Payer: Self-pay | Admitting: Internal Medicine

## 2017-03-15 ENCOUNTER — Other Ambulatory Visit: Payer: Self-pay

## 2017-03-27 ENCOUNTER — Encounter: Payer: Self-pay | Admitting: Internal Medicine

## 2017-03-27 ENCOUNTER — Other Ambulatory Visit (INDEPENDENT_AMBULATORY_CARE_PROVIDER_SITE_OTHER): Payer: Medicare Other

## 2017-03-27 ENCOUNTER — Ambulatory Visit (INDEPENDENT_AMBULATORY_CARE_PROVIDER_SITE_OTHER): Payer: Medicare Other | Admitting: Internal Medicine

## 2017-03-27 VITALS — BP 154/94 | HR 98 | Temp 98.4°F | Resp 16 | Ht 62.0 in | Wt 210.0 lb

## 2017-03-27 DIAGNOSIS — E7849 Other hyperlipidemia: Secondary | ICD-10-CM

## 2017-03-27 DIAGNOSIS — Z0279 Encounter for issue of other medical certificate: Secondary | ICD-10-CM

## 2017-03-27 DIAGNOSIS — Z1211 Encounter for screening for malignant neoplasm of colon: Secondary | ICD-10-CM

## 2017-03-27 DIAGNOSIS — E119 Type 2 diabetes mellitus without complications: Secondary | ICD-10-CM | POA: Diagnosis not present

## 2017-03-27 DIAGNOSIS — K589 Irritable bowel syndrome without diarrhea: Secondary | ICD-10-CM | POA: Insufficient documentation

## 2017-03-27 DIAGNOSIS — Z23 Encounter for immunization: Secondary | ICD-10-CM

## 2017-03-27 DIAGNOSIS — K580 Irritable bowel syndrome with diarrhea: Secondary | ICD-10-CM | POA: Diagnosis not present

## 2017-03-27 LAB — COMPREHENSIVE METABOLIC PANEL
ALBUMIN: 4.2 g/dL (ref 3.5–5.2)
ALK PHOS: 98 U/L (ref 39–117)
ALT: 19 U/L (ref 0–35)
AST: 23 U/L (ref 0–37)
BUN: 12 mg/dL (ref 6–23)
CO2: 29 mEq/L (ref 19–32)
Calcium: 9.2 mg/dL (ref 8.4–10.5)
Chloride: 104 mEq/L (ref 96–112)
Creatinine, Ser: 1.15 mg/dL (ref 0.40–1.20)
GFR: 59.89 mL/min — AB (ref >60.00)
Glucose, Bld: 120 mg/dL — ABNORMAL HIGH (ref 70–99)
POTASSIUM: 3.7 meq/L (ref 3.5–5.1)
Sodium: 140 mEq/L (ref 135–145)
TOTAL PROTEIN: 7.4 g/dL (ref 6.0–8.3)
Total Bilirubin: 0.9 mg/dL (ref 0.2–1.2)

## 2017-03-27 LAB — HEMOGLOBIN A1C: Hgb A1c MFr Bld: 6.7 % — ABNORMAL HIGH (ref 4.6–6.5)

## 2017-03-27 MED ORDER — VENLAFAXINE HCL ER 75 MG PO CP24: 75.0000 mg | ORAL_CAPSULE | Freq: Every day | ORAL | 1 refills | Status: DC

## 2017-03-27 MED ORDER — ROSUVASTATIN CALCIUM 40 MG PO TABS: 40.0000 mg | ORAL_TABLET | Freq: Every day | ORAL | 1 refills | Status: DC

## 2017-03-27 MED ORDER — METFORMIN HCL 500 MG PO TABS: 500.0000 mg | ORAL_TABLET | Freq: Every day | ORAL | 1 refills | Status: DC

## 2017-03-27 NOTE — Assessment & Plan Note (Signed)
Check a1c Low sugar / carb diet Stressed regular exercise, weight loss  

## 2017-03-27 NOTE — Progress Notes (Signed)
Subjective:    Patient ID: Veronica Carrillo, female    DOB: Aug 10, 1945, 71 y.o.   MRN: [id number]  HPI The patient is here for follow up.  IBS:  She was diagnosed with IBS years ago and has been taking imodium daily.  She has daily diarrhea.  She has missed work because of this.  Her job wants her to get on FMLA to protect her job.    She takes Imodium - 1 pill a day.  This typically controls the diarrhea , except for about once a week she will still have diarrhea.  She does not take additional Imodium.  The stool is foul smelling.  There is no blood in the stool.  She has abdominal cramping and it goes away after she has a BM.  She has never tried any other prescription medication for irritable bowel syndrome, antibiotics or probiotics.  She has not seen GI recently regarding her IBS.  She is due for a colonoscopy.  FMLA for at most once a week  - 4 hours.     Diabetes: She is taking her medication daily as prescribed. She is compliant with a diabetic diet. She is not exercising regularly.  She checks her feet daily and denies foot lesions. She is up-to-date with an ophthalmology examination.   Hypertension: She is taking her medication daily. She is compliant with a low sodium diet.  She denies chest pain, palpitations, edema, shortness of breath and regular headaches. She is exercising irregularly.  She does not monitor her blood pressure at home.    Hyperlipidemia: She is taking her medication daily. She is compliant with a low fat/cholesterol diet. She is not exercising regularly. She denies myalgias.     Medications and allergies reviewed with patient and updated if appropriate.  Patient Active Problem List   Diagnosis Date Noted  . Irritable bowel syndrome (IBS) 03/27/2017  . Acute pain of right knee 11/28/2016  . Hyperlipidemia 10/01/2015  . Diverticulitis of colon 10/01/2015  . Diabetes ([place]) 10/01/2015  . Depression 10/01/2015  . Anxiety 10/01/2015  . Arthritis of right  knee 10/01/2015    Current Outpatient Medications on File Prior to Visit  Medication Sig Dispense Refill  . fesoterodine (TOVIAZ) 4 MG TB24 tablet Take 4 mg by mouth daily.    Marland Kitchen loperamide (IMODIUM) 2 MG capsule Take 2-4 mg by mouth 3 (three) times daily as needed for diarrhea or loose stools.    . saccharomyces boulardii (FLORASTOR) 250 MG capsule Take 1 capsule (250 mg total) by mouth 2 (two) times daily. 60 capsule 0   No current facility-administered medications on file prior to visit.     Past Medical History:  Diagnosis Date  . Depression   . Diabetes mellitus without complication ([place])   . Diverticulitis   . History of colon polyps   . Hyperlipidemia   . IBS (irritable bowel syndrome)     Past Surgical History:  Procedure Laterality Date  . ABDOMINAL EXPLORATION SURGERY    . ABDOMINAL HYSTERECTOMY    . BREAST BIOPSY Left   . DILATION AND CURETTAGE OF UTERUS     x 3  . TONSILLECTOMY      Social History   Socioeconomic History  . Marital status: Single    Spouse name: None  . Number of children: 1  . Years of education: None  . Highest education level: None  Social Needs  . Financial resource strain: None  . Food insecurity - worry:  None  . Food insecurity - inability: None  . Transportation needs - medical: None  . Transportation needs - non-medical: None  Occupational History  . Occupation: retired  . Occupation: PT recepionist  Tobacco Use  . Smoking status: Never Smoker  . Smokeless tobacco: Never Used  Substance and Sexual Activity  . Alcohol use: Yes    Alcohol/week: 0.0 oz    Comment: wine cooler-ocassional  . Drug use: No  . Sexual activity: None  Other Topics Concern  . None  Social History Narrative   Exercise: [employer], walking      Moved from [place]    Family History  Problem Relation Age of Onset  . Hypertension Mother   . Bladder Cancer Father   . Heart disease Father   . Diabetes Paternal Grandmother     Review of Systems    Constitutional: Negative for chills and fever.  Respiratory: Negative for cough, shortness of breath and wheezing.   Cardiovascular: Negative for chest pain, palpitations and leg swelling.  Gastrointestinal: Positive for abdominal pain (Abdominal cramping with IBS) and diarrhea. Negative for blood in stool.  Neurological: Negative for light-headedness and headaches.       Objective:   Vitals:   03/27/17 1345  BP: (!) 154/94  Pulse: 98  Resp: 16  Temp: 98.4 F (36.9 C)  SpO2: 98%   Wt Readings from Last 3 Encounters:  03/27/17 210 lb (95.3 kg)  11/28/16 213 lb (96.6 kg)  09/25/16 217 lb (98.4 kg)   Body mass index is 38.41 kg/m.   Physical Exam    Constitutional: Appears well-developed and well-nourished. No distress.  HENT:  Head: Normocephalic and atraumatic.  Neck: Neck supple. No tracheal deviation present. No thyromegaly present.  No cervical lymphadenopathy Cardiovascular: Normal rate, regular rhythm and normal heart sounds.   No murmur heard. No carotid bruit .  No edema Pulmonary/Chest: Effort normal and breath sounds normal. No respiratory distress. No has no wheezes. No rales.  Abdomen: Soft, nontender, nondistended Skin: Skin is warm and dry. Not diaphoretic.  Psychiatric: Normal mood and affect. Behavior is normal.      Assessment & Plan:    See Problem List for Assessment and Plan of chronic medical problems.

## 2017-03-27 NOTE — Patient Instructions (Addendum)
  Test(s) ordered today. Your results will be released to [place] (or called to you) after review, usually within 72hours after test completion. If any changes need to be made, you will be notified at that same time.  All other Health Maintenance issues reviewed.   All recommended immunizations and age-appropriate screenings are up-to-date or discussed.  Flu immunization administered today.   Medications reviewed and updated.  No changes recommended at this time.  Your prescription(s) have been submitted to your pharmacy. Please take as directed and contact our office if you believe you are having problem(s) with the medication(s).  A referral was ordered for GI - Dr Henrene Pastor  FU in 6 months

## 2017-03-27 NOTE — Assessment & Plan Note (Signed)
Check lipid panel  Continue daily statin Regular exercise and healthy diet encouraged  

## 2017-03-27 NOTE — Assessment & Plan Note (Signed)
Chronic diarrhea x years Taking 1 imodium daily - fairly controlled, but having accidents about once a week and needs FMLA  has never tried xifaxan, viberzi or probiotics Due for a colonoscopy Does want to wait until she sees GI before trying any of the above Referred to GI

## 2017-04-30 ENCOUNTER — Encounter: Payer: Self-pay | Admitting: Internal Medicine

## 2017-05-08 ENCOUNTER — Telehealth: Payer: Self-pay | Admitting: Internal Medicine

## 2017-05-08 NOTE — Telephone Encounter (Signed)
Yes inform her we can not add things to [facility].

## 2017-05-08 NOTE — Telephone Encounter (Signed)
Copied from [place]. Topic: General - Other >> May 08, 2017  1:59 PM Aurelio Brash B wrote: Reason for CRM: Pt states she has FMLA  for her stomach  but she needs to add  right knee to to the form  because she missed yesterday and today  from work  due to  right knee pain and swelling  Pt is asking for a call back.   Dr.Burns filled out FMLA for her in Dec 2018. I can call any inform her that FMLA doesn't work that way. You can not just add reason's when you call out of work. Her FMLA is for her IBS. Please advise.

## 2017-05-09 ENCOUNTER — Encounter: Payer: Self-pay | Admitting: Internal Medicine

## 2017-05-09 ENCOUNTER — Ambulatory Visit (INDEPENDENT_AMBULATORY_CARE_PROVIDER_SITE_OTHER): Payer: Medicare Other | Admitting: Internal Medicine

## 2017-05-09 VITALS — BP 150/90 | HR 112 | Temp 98.2°F | Resp 16 | Wt 210.0 lb

## 2017-05-09 DIAGNOSIS — M25561 Pain in right knee: Secondary | ICD-10-CM

## 2017-05-09 NOTE — Telephone Encounter (Signed)
Spoke with patient, she had made an appointment and saw Dr.Burns about this. It was explained to her. She is going to see the [facility] doctor about her knee.

## 2017-05-09 NOTE — Patient Instructions (Signed)
You are likely having a flare of your knee arthritis.   Continue to ice the knee 2-3 times a day.  Continue Aspercreme or Biofreeze.  Continue the aleve - only take as needed.     Osteoarthritis Osteoarthritis is a type of arthritis that affects tissue that covers the ends of bones in joints (cartilage). Cartilage acts as a cushion between the bones and helps them move smoothly. Osteoarthritis results when cartilage in the joints gets worn down. Osteoarthritis is sometimes called "wear and tear" arthritis. Osteoarthritis is the most common form of arthritis. It often occurs in older people. It is a condition that gets worse over time (a progressive condition). Joints that are most often affected by this condition are in:  Fingers.  Toes.  Hips.  Knees.  Spine, including neck and lower back.  What are the causes? This condition is caused by age-related wearing down of cartilage that covers the ends of bones. What increases the risk? The following factors may make you more likely to develop this condition:  Older age.  Being overweight or obese.  Overuse of joints, such as in athletes.  Past injury of a joint.  Past surgery on a joint.  Family history of osteoarthritis.  What are the signs or symptoms? The main symptoms of this condition are pain, swelling, and stiffness in the joint. The joint may lose its shape over time. Small pieces of bone or cartilage may break off and float inside of the joint, which may cause more pain and damage to the joint. Small deposits of bone (osteophytes) may grow on the edges of the joint. Other symptoms may include:  A grating or scraping feeling inside the joint when you move it.  Popping or creaking sounds when you move.  Symptoms may affect one or more joints. Osteoarthritis in a major joint, such as your knee or hip, can make it painful to walk or exercise. If you have osteoarthritis in your hands, you might not be able to grip items,  twist your hand, or control small movements of your hands and fingers (fine motor skills). How is this diagnosed? This condition may be diagnosed based on:  Your medical history.  A physical exam.  Your symptoms.  X-rays of the affected joint(s).  Blood tests to rule out other types of arthritis.  How is this treated? There is no cure for this condition, but treatment can help to control pain and improve joint function. Treatment plans may include:  A prescribed exercise program that allows for rest and joint relief. You may work with a physical therapist.  A weight control plan.  Pain relief techniques, such as: ? Applying heat and cold to the joint. ? Electric pulses delivered to nerve endings under the skin (transcutaneous electrical nerve stimulation, or TENS). ? Massage. ? Certain nutritional supplements.  NSAIDs or prescription medicines to help relieve pain.  Medicine to help relieve pain and inflammation (corticosteroids). This can be given by mouth (orally) or as an injection.  Assistive devices, such as a brace, wrap, splint, specialized glove, or cane.  Surgery, such as: ? An osteotomy. This is done to reposition the bones and relieve pain or to remove loose pieces of bone and cartilage. ? Joint replacement surgery. You may need this surgery if you have very bad (advanced) osteoarthritis.  Follow these instructions at home: Activity  Rest your affected joints as directed by your health care provider.  Do not drive or use heavy machinery while taking prescription  pain medicine.  Exercise as directed. Your health care provider or physical therapist may recommend specific types of exercise, such as: ? Strengthening exercises. These are done to strengthen the muscles that support joints that are affected by arthritis. They can be performed with weights or with exercise bands to add resistance. ? Aerobic activities. These are exercises, such as brisk walking or  water aerobics, that get your heart pumping. ? Range-of-motion activities. These keep your joints easy to move. ? Balance and agility exercises. Managing pain, stiffness, and swelling  If directed, apply heat to the affected area as often as told by your health care provider. Use the heat source that your health care provider recommends, such as a moist heat pack or a heating pad. ? If you have a removable assistive device, remove it as told by your health care provider. ? Place a towel between your skin and the heat source. If your health care provider tells you to keep the assistive device on while you apply heat, place a towel between the assistive device and the heat source. ? Leave the heat on for 20-30 minutes. ? Remove the heat if your skin turns bright red. This is especially important if you are unable to feel pain, heat, or cold. You may have a greater risk of getting burned.  If directed, put ice on the affected joint: ? If you have a removable assistive device, remove it as told by your health care provider. ? Put ice in a plastic bag. ? Place a towel between your skin and the bag. If your health care provider tells you to keep the assistive device on during icing, place a towel between the assistive device and the bag. ? Leave the ice on for 20 minutes, 2-3 times a day. General instructions  Take over-the-counter and prescription medicines only as told by your health care provider.  Maintain a healthy weight. Follow instructions from your health care provider for weight control. These may include dietary restrictions.  Do not use any products that contain nicotine or tobacco, such as cigarettes and e-cigarettes. These can delay bone healing. If you need help quitting, ask your health care provider.  Use assistive devices as directed by your health care provider.  Keep all follow-up visits as told by your health care provider. This is important. Where to find more  information:  [employer] of Arthritis and Musculoskeletal and Skin Diseases: www.niams.SouthExposed.es  [employer] on Aging: http://kim-miller.com/  American College of Rheumatology: www.rheumatology.org Contact a health care provider if:  Your skin turns red.  You develop a rash.  You have pain that gets worse.  You have a fever along with joint or muscle aches. Get help right away if:  You lose a lot of weight.  You suddenly lose your appetite.  You have night sweats. Summary  Osteoarthritis is a type of arthritis that affects tissue covering the ends of bones in joints (cartilage).  This condition is caused by age-related wearing down of cartilage that covers the ends of bones.  The main symptom of this condition is pain, swelling, and stiffness in the joint.  There is no cure for this condition, but treatment can help to control pain and improve joint function. This information is not intended to replace advice given to you by your health care provider. Make sure you discuss any questions you have with your health care provider. Document Released: 03/27/2005 Document Revised: 11/29/2015 Document Reviewed: 11/29/2015 Elsevier Interactive Patient Education  2018 Elsevier  Inc.  

## 2017-05-09 NOTE — Progress Notes (Signed)
Subjective:    Patient ID: Veronica Carrillo, female    DOB: November 13, 1945, 72 y.o.   MRN: [id number]  HPI She is here for an acute visit for knee pain.  Right knee pain:  Three days ago she did more walking.  The next day it was swollen and painful.  She was not able to stand on it.  She iced it, took aleve and put Aspercreme on it.  It is stiff and aches.  It does ache and gets stiff intermittently, which is not new.  She barely can walk on it, especially early in the morning.  Monday she did not go to work.  Tuesday and Wednesday she was not able to go to work.    Medications and allergies reviewed with patient and updated if appropriate.  Patient Active Problem List   Diagnosis Date Noted  . Irritable bowel syndrome (IBS) 03/27/2017  . Acute pain of right knee 11/28/2016  . Hyperlipidemia 10/01/2015  . Diverticulitis of colon 10/01/2015  . Diabetes ([place]) 10/01/2015  . Depression 10/01/2015  . Anxiety 10/01/2015  . Arthritis of right knee 10/01/2015    Current Outpatient Medications on File Prior to Visit  Medication Sig Dispense Refill  . fesoterodine (TOVIAZ) 4 MG TB24 tablet Take 4 mg by mouth daily.    Marland Kitchen loperamide (IMODIUM) 2 MG capsule Take 2-4 mg by mouth 3 (three) times daily as needed for diarrhea or loose stools.    . metFORMIN (GLUCOPHAGE) 500 MG tablet Take 1 tablet (500 mg total) by mouth daily with breakfast. 90 tablet 1  . rosuvastatin (CRESTOR) 40 MG tablet Take 1 tablet (40 mg total) by mouth daily. 90 tablet 1  . saccharomyces boulardii (FLORASTOR) 250 MG capsule Take 1 capsule (250 mg total) by mouth 2 (two) times daily. 60 capsule 0  . venlafaxine XR (EFFEXOR-XR) 75 MG 24 hr capsule Take 1 capsule (75 mg total) by mouth daily. 90 capsule 1   No current facility-administered medications on file prior to visit.     Past Medical History:  Diagnosis Date  . Depression   . Diabetes mellitus without complication ([place])   . Diverticulitis   . History of colon  polyps   . Hyperlipidemia   . IBS (irritable bowel syndrome)     Past Surgical History:  Procedure Laterality Date  . ABDOMINAL EXPLORATION SURGERY    . ABDOMINAL HYSTERECTOMY    . BREAST BIOPSY Left   . DILATION AND CURETTAGE OF UTERUS     x 3  . TONSILLECTOMY      Social History   Socioeconomic History  . Marital status: Single    Spouse name: Not on file  . Number of children: 1  . Years of education: Not on file  . Highest education level: Not on file  Social Needs  . Financial resource strain: Not on file  . Food insecurity - worry: Not on file  . Food insecurity - inability: Not on file  . Transportation needs - medical: Not on file  . Transportation needs - non-medical: Not on file  Occupational History  . Occupation: retired  . Occupation: PT recepionist  Tobacco Use  . Smoking status: Never Smoker  . Smokeless tobacco: Never Used  Substance and Sexual Activity  . Alcohol use: Yes    Alcohol/week: 0.0 oz    Comment: wine cooler-ocassional  . Drug use: No  . Sexual activity: Not on file  Other Topics Concern  . Not on file  Social History Narrative   Exercise: [employer], walking      Moved from [place]    Family History  Problem Relation Age of Onset  . Hypertension Mother   . Bladder Cancer Father   . Heart disease Father   . Diabetes Paternal Grandmother     Review of Systems  Constitutional: Negative for chills and fever.  Cardiovascular: Negative for leg swelling.  Musculoskeletal: Positive for arthralgias, gait problem and joint swelling.  Skin: Negative for color change.  Neurological: Negative for numbness.       Objective:   Vitals:   05/09/17 1307  BP: (!) 150/90  Pulse: (!) 112  Resp: 16  Temp: 98.2 F (36.8 C)  SpO2: 98%   Wt Readings from Last 3 Encounters:  05/09/17 210 lb (95.3 kg)  03/27/17 210 lb (95.3 kg)  11/28/16 213 lb (96.6 kg)   Body mass index is 38.41 kg/m.   Physical Exam    A right knee exam was performed.    SKIN: intact, no bruising    SWELLING: no  EFFUSION: yes, mild  WARMTH: no warmth  TENDERNESS: mild tenderness distal and medial aspects of the knee ROM: full extension, full flexion  - pain at both extremes GAIT: walks with a limp   NEUROLOGICAL EXAM: normal sensation  CALF TENDERNESS: no      Assessment & Plan:    See Problem List for Assessment and Plan of chronic medical problems.

## 2017-05-10 ENCOUNTER — Ambulatory Visit (INDEPENDENT_AMBULATORY_CARE_PROVIDER_SITE_OTHER): Payer: Medicare Other | Admitting: Family Medicine

## 2017-05-10 ENCOUNTER — Encounter: Payer: Self-pay | Admitting: Family Medicine

## 2017-05-10 DIAGNOSIS — M25561 Pain in right knee: Secondary | ICD-10-CM

## 2017-05-10 MED ORDER — NAPROXEN 500 MG PO TABS: 500.0000 mg | ORAL_TABLET | Freq: Two times a day (BID) | ORAL | 1 refills | Status: AC

## 2017-05-10 NOTE — Patient Instructions (Signed)
Take the naproxen two times daily for 14 days straight and then as needed.   Take tylenol 650 mg three times a day is the best evidence based medicine we have for arthritis.  Glucosamine sulfate 750mg  twice a day is a supplement that has been shown to help moderate to severe arthritis.  Vitamin D 2000 IU daily  Fish oil 2 grams daily.   Tumeric 500mg  twice daily.   Capsaicin topically up to four times a day may also help with pain.  Cortisone injections are an option if these interventions do not seem to make a difference or need more relief.   It's important that you continue to stay active.  Controlling your weight is important.   Consider physical therapy to strengthen muscles around the joint that hurts to take pressure off of the joint itself.  Shoe inserts with good arch support may be helpful.    Water aerobics and cycling with low resistance are the best two types of exercise for arthritis.

## 2017-05-10 NOTE — Progress Notes (Addendum)
Veronica Carrillo - 72 y.o. female MRN [id number]  Date of birth: 10/11/1945  SUBJECTIVE:  Including CC & ROS.  Chief Complaint  Patient presents with  . RIght knee pain    Veronica Carrillo is a 72 y.o. female that is here for an evaluation of right knee pain. Pain is chronic, increasing over the past week. Pain is constant ache, located anteriorly and extends to the medial area. She has been applying ice and aleve with no improvement. Denies injury or surgeries. Pain worsens when walking.  Denies any change in the pain. Pain is localized. She feels like her knee might give out at times. Denies any locking of the knee. Denies any redness.   Independent review of the right knee xray from 11/28/16 shows mild to moderate joint space narrowing.    Review of Systems  Constitutional: Negative for fever.  Respiratory: Negative for cough.   Cardiovascular: Negative for chest pain.  Gastrointestinal: Negative for abdominal pain.  Musculoskeletal: Positive for arthralgias.  Skin: Negative for color change.  Neurological: Negative for weakness.  Hematological: Negative for adenopathy.  Psychiatric/Behavioral: Negative for agitation.    HISTORY: Past Medical, Surgical, Social, and Family History Reviewed & Updated per EMR.   Pertinent Historical Findings include:  Past Medical History:  Diagnosis Date  . Depression   . Diabetes mellitus without complication ([place])   . Diverticulitis   . History of colon polyps   . Hyperlipidemia   . IBS (irritable bowel syndrome)     Past Surgical History:  Procedure Laterality Date  . ABDOMINAL EXPLORATION SURGERY    . ABDOMINAL HYSTERECTOMY    . BREAST BIOPSY Left   . DILATION AND CURETTAGE OF UTERUS     x 3  . TONSILLECTOMY      Allergies  Allergen Reactions  . Codeine Nausea And Vomiting    Family History  Problem Relation Age of Onset  . Hypertension Mother   . Bladder Cancer Father   . Heart disease Father   . Diabetes Paternal  Grandmother      Social History   Socioeconomic History  . Marital status: Single    Spouse name: Not on file  . Number of children: 1  . Years of education: Not on file  . Highest education level: Not on file  Social Needs  . Financial resource strain: Not on file  . Food insecurity - worry: Not on file  . Food insecurity - inability: Not on file  . Transportation needs - medical: Not on file  . Transportation needs - non-medical: Not on file  Occupational History  . Occupation: retired  . Occupation: PT recepionist  Tobacco Use  . Smoking status: Never Smoker  . Smokeless tobacco: Never Used  Substance and Sexual Activity  . Alcohol use: Yes    Alcohol/week: 0.0 oz    Comment: wine cooler-ocassional  . Drug use: No  . Sexual activity: Not on file  Other Topics Concern  . Not on file  Social History Narrative   Exercise: [employer], walking      Moved from [place]:  VS: BP 132/67 (BP Location: Left Arm, Patient Position: Sitting, Cuff Size: Normal)   Pulse 93   Temp 98 F (36.7 C) (Oral)   Ht 5\' 2"  (1.575 m)   Wt 210 lb (95.3 kg)   SpO2 98%   BMI 38.41 kg/m  Physical Exam Gen: NAD, alert, cooperative with exam, well-appearing ENT: normal lips, normal  nasal mucosa,  Eye: normal EOM, normal conjunctiva and lids CV:  no edema, +2 pedal pulses   Resp: no accessory muscle use, non-labored,  Skin: no rashes, no areas of induration  Neuro: normal tone, normal sensation to touch Psych:  normal insight, alert and oriented MSK:  Right knee:  No effusion  Normal flexion and extension  Normal valgus and varus testing  No joint line tenderness  Normal McMurray's test  No pain with patellar grind or compression  Neurovascularly intact   Limited ultrasound: right knee:  No effusion  Medial joint line narrowing with outpouching of the medial meniscus   Summary: degenerative changes of the medial joint space.   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 She elects to take naproxen and try other supportive measures.  - if no improvement then try injection.

## 2017-05-11 NOTE — Assessment & Plan Note (Signed)
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 She elects to take naproxen and try other supportive measures.  - if no improvement then try injection.

## 2017-05-24 ENCOUNTER — Other Ambulatory Visit: Payer: Self-pay

## 2017-05-24 ENCOUNTER — Ambulatory Visit (AMBULATORY_SURGERY_CENTER): Payer: Self-pay

## 2017-05-24 VITALS — Ht 63.0 in | Wt 215.4 lb

## 2017-05-24 DIAGNOSIS — Z86010 Personal history of colonic polyps: Secondary | ICD-10-CM

## 2017-05-24 MED ORDER — NA SULFATE-K SULFATE-MG SULF 17.5-3.13-1.6 GM/177ML PO SOLN: 1.0000 | Freq: Once | ORAL | 0 refills | Status: AC

## 2017-05-24 NOTE — Progress Notes (Signed)
Denies allergies to eggs or soy products. Denies complication of anesthesia or sedation. Denies use of weight loss medication. Denies use of O2.   Emmi instructions declined.  

## 2017-06-07 ENCOUNTER — Ambulatory Visit (AMBULATORY_SURGERY_CENTER): Payer: Medicare Other | Admitting: Internal Medicine

## 2017-06-07 ENCOUNTER — Other Ambulatory Visit: Payer: Self-pay

## 2017-06-07 ENCOUNTER — Encounter: Payer: Self-pay | Admitting: Internal Medicine

## 2017-06-07 VITALS — BP 134/68 | HR 80 | Temp 98.0°F | Resp 19 | Ht 63.0 in | Wt 215.0 lb

## 2017-06-07 DIAGNOSIS — Z1211 Encounter for screening for malignant neoplasm of colon: Secondary | ICD-10-CM | POA: Diagnosis not present

## 2017-06-07 DIAGNOSIS — Z86010 Personal history of colonic polyps: Secondary | ICD-10-CM

## 2017-06-07 MED ORDER — SODIUM CHLORIDE 0.9 % IV SOLN: 500.0000 mL | Freq: Once | INTRAVENOUS | Status: DC

## 2017-06-07 NOTE — Progress Notes (Signed)
Pt's states no medical or surgical changes since previsit or office visit. 

## 2017-06-07 NOTE — Op Note (Signed)
[facility] Patient Name: Veronica Carrillo Procedure Date: 06/07/2017 8:10 AM MRN: [id number] Endoscopist: Docia Chuck. Henrene Pastor , MD Age: 72 Referring MD:  Date of Birth: 1945-09-26 Gender: Female Account #: [id number] Procedure:                Colonoscopy Indications:              High risk colon cancer surveillance: Personal                            history of colonic polyps. Colonoscopy October 2013                            in [place] 5 mm polyp (no pathology available for                            review) told to follow-up in 5 years Medicines:                Monitored Anesthesia Care Procedure:                Pre-Anesthesia Assessment:                           - Prior to the procedure, a History and Physical                            was performed, and patient medications and                            allergies were reviewed. The patient's tolerance of                            previous anesthesia was also reviewed. The risks                            and benefits of the procedure and the sedation                            options and risks were discussed with the patient.                            All questions were answered, and informed consent                            was obtained. Prior Anticoagulants: The patient has                            taken no previous anticoagulant or antiplatelet                            agents. ASA Grade Assessment: II - A patient with                            mild systemic disease. After reviewing the risks  and benefits, the patient was deemed in                            satisfactory condition to undergo the procedure.                           After obtaining informed consent, the colonoscope                            was passed under direct vision. Throughout the                            procedure, the patient's blood pressure, pulse, and                            oxygen saturations were  monitored continuously. The                            Colonoscope was introduced through the anus and                            advanced to the the cecum, identified by                            appendiceal orifice and ileocecal valve. The                            ileocecal valve, appendiceal orifice, and rectum                            were photographed. The quality of the bowel                            preparation was excellent. The colonoscopy was                            performed without difficulty. The patient tolerated                            the procedure well. The bowel preparation used was                            SUPREP. Scope In: 8:31:39 AM Scope Out: 8:41:36 AM Scope Withdrawal Time: 0 hours 7 minutes 56 seconds  Total Procedure Duration: 0 hours 9 minutes 57 seconds  Findings:                 Multiple small and large-mouthed diverticula were                            found in the entire colon. Complications:            No immediate complications. Estimated blood loss:                            None.  Estimated Blood Loss:     Estimated blood loss: none. Impression:               - Diverticulosis in the entire examined colon.                           - No specimens collected. Recommendation:           - Repeat colonoscopy is not recommended for                            surveillance.                           - Patient has a contact number available for                            emergencies. The signs and symptoms of potential                            delayed complications were discussed with the                            patient. Return to normal activities tomorrow.                            Written discharge instructions were provided to the                            patient.                           - Resume previous diet.                           - Continue present medications. Docia Chuck. Henrene Pastor, MD 06/07/2017 8:48:15 AM This report has been  signed electronically.

## 2017-06-07 NOTE — Patient Instructions (Signed)
**   Handout given on diverticulosis **   YOU HAD AN ENDOSCOPIC PROCEDURE TODAY AT THE [facility]:   Refer to the procedure report that was given to you for any specific questions about what was found during the examination.  If the procedure report does not answer your questions, please call your gastroenterologist to clarify.  If you requested that your care partner not be given the details of your procedure findings, then the procedure report has been included in a sealed envelope for you to review at your convenience later.  YOU SHOULD EXPECT: Some feelings of bloating in the abdomen. Passage of more gas than usual.  Walking can help get rid of the air that was put into your GI tract during the procedure and reduce the bloating. If you had a lower endoscopy (such as a colonoscopy or flexible sigmoidoscopy) you may notice spotting of blood in your stool or on the toilet paper. If you underwent a bowel prep for your procedure, you may not have a normal bowel movement for a few days.  Please Note:  You might notice some irritation and congestion in your nose or some drainage.  This is from the oxygen used during your procedure.  There is no need for concern and it should clear up in a day or so.  SYMPTOMS TO REPORT IMMEDIATELY:   Following lower endoscopy (colonoscopy or flexible sigmoidoscopy):  Excessive amounts of blood in the stool  Significant tenderness or worsening of abdominal pains  Swelling of the abdomen that is new, acute  Fever of 100F or higher  For urgent or emergent issues, a gastroenterologist can be reached at any hour by calling [phone].   DIET:  We do recommend a small meal at first, but then you may proceed to your regular diet.  Drink plenty of fluids but you should avoid alcoholic beverages for 24 hours.  ACTIVITY:  You should plan to take it easy for the rest of today and you should NOT DRIVE or use heavy machinery until tomorrow (because of the  sedation medicines used during the test).    FOLLOW UP: Our staff will call the number listed on your records the next business day following your procedure to check on you and address any questions or concerns that you may have regarding the information given to you following your procedure. If we do not reach you, we will leave a message.  However, if you are feeling well and you are not experiencing any problems, there is no need to return our call.  We will assume that you have returned to your regular daily activities without incident.  If any biopsies were taken you will be contacted by phone or by letter within the next 1-3 weeks.  Please call us at [phone] if you have not heard about the biopsies in 3 weeks.    SIGNATURES/CONFIDENTIALITY: You and/or your care partner have signed paperwork which will be entered into your electronic medical record.  These signatures attest to the fact that that the information above on your After Visit Summary has been reviewed and is understood.  Full responsibility of the confidentiality of this discharge information lies with you and/or your care-partner. 

## 2017-06-07 NOTE — Progress Notes (Signed)
A and O x3. Report to RN. Tolerated MAC anesthesia well.

## 2017-06-08 ENCOUNTER — Telehealth: Payer: Self-pay | Admitting: *Deleted

## 2017-06-08 NOTE — Telephone Encounter (Signed)
  Follow up Call-  Call back number 06/07/2017  Post procedure Call Back phone  # [phone]  Permission to leave phone message Yes  Some recent data might be hidden     Patient questions:  Do you have a fever, pain , or abdominal swelling? No. Pain Score  0 *  Have you tolerated food without any problems? Yes.    Have you been able to return to your normal activities? Yes.    Do you have any questions about your discharge instructions: Diet   Yes.   Medications  Yes.   Follow up visit  No.  Do you have questions or concerns about your Care? No. Pt.states she has a lot of gas.  Wonders what she should take for it and what to eat. I advised her to take OTC gas medication with simethecone.  Also, advised her to stay away from any gas producing food today, drink warm liquids, and to move around as much as possible to help air move.  She will call us back if her complaints don't resolve. Actions: * If pain score is 4 or above: No action needed, pain <4.

## 2017-07-06 ENCOUNTER — Ambulatory Visit: Payer: Medicare Other | Admitting: Podiatry

## 2017-07-13 ENCOUNTER — Ambulatory Visit (INDEPENDENT_AMBULATORY_CARE_PROVIDER_SITE_OTHER): Payer: Medicare Other | Admitting: Podiatry

## 2017-07-13 ENCOUNTER — Ambulatory Visit: Payer: Medicare Other | Admitting: Podiatry

## 2017-07-13 ENCOUNTER — Ambulatory Visit (INDEPENDENT_AMBULATORY_CARE_PROVIDER_SITE_OTHER): Payer: Medicare Other

## 2017-07-13 ENCOUNTER — Telehealth: Payer: Self-pay | Admitting: Internal Medicine

## 2017-07-13 ENCOUNTER — Encounter: Payer: Self-pay | Admitting: Podiatry

## 2017-07-13 VITALS — BP 152/95 | HR 94 | Resp 16

## 2017-07-13 DIAGNOSIS — M2041 Other hammer toe(s) (acquired), right foot: Secondary | ICD-10-CM | POA: Diagnosis not present

## 2017-07-13 NOTE — Telephone Encounter (Signed)
Pt dropped off a request for additional information for FMLA.  Per pt, please complete forms and send them to [place] at [phone].  Also, please call pt [phone]) once forms have been faxed.  Forms placed in Brittany's box.

## 2017-07-13 NOTE — Patient Instructions (Signed)
Pre-Operative Instructions  Congratulations, you have decided to take an important step towards improving your quality of life.  You can be assured that the doctors and staff at [facility] will be with you every step of the way.  Here are some important things you should know:  1. Plan to be at the surgery center/hospital at least 1 (one) hour prior to your scheduled time, unless otherwise directed by the surgical center/hospital staff.  You must have a responsible adult accompany you, remain during the surgery and drive you home.  Make sure you have directions to the surgical center/hospital to ensure you arrive on time. 2. If you are having surgery at [facility] or [facility] hospitals, you will need a copy of your medical history and physical form from your family physician within one month prior to the date of surgery. We will give you a form for your primary physician to complete.  3. We make every effort to accommodate the date you request for surgery.  However, there are times where surgery dates or times have to be moved.  We will contact you as soon as possible if a change in schedule is required.   4. No aspirin/ibuprofen for one week before surgery.  If you are on aspirin, any non-steroidal anti-inflammatory medications (Mobic, Aleve, Ibuprofen) should not be taken seven (7) days prior to your surgery.  You make take Tylenol for pain prior to surgery.  5. Medications - If you are taking daily heart and blood pressure medications, seizure, reflux, allergy, asthma, anxiety, pain or diabetes medications, make sure you notify the surgery center/hospital before the day of surgery so they can tell you which medications you should take or avoid the day of surgery. 6. No food or drink after midnight the night before surgery unless directed otherwise by surgical center/hospital staff. 7. No alcoholic beverages 24-hours prior to surgery.  No smoking 24-hours prior or 24-hours after  surgery. 8. Wear loose pants or shorts. They should be loose enough to fit over bandages, boots, and casts. 9. Don't wear slip-on shoes. Sneakers are preferred. 10. Bring your boot with you to the surgery center/hospital.  Also bring crutches or a walker if your physician has prescribed it for you.  If you do not have this equipment, it will be provided for you after surgery. 11. If you have not been contacted by the surgery center/hospital by the day before your surgery, call to confirm the date and time of your surgery. 12. Leave-time from work may vary depending on the type of surgery you have.  Appropriate arrangements should be made prior to surgery with your employer. 13. Prescriptions will be provided immediately following surgery by your doctor.  Fill these as soon as possible after surgery and take the medication as directed. Pain medications will not be refilled on weekends and must be approved by the doctor. 14. Remove nail polish on the operative foot and avoid getting pedicures prior to surgery. 15. Wash the night before surgery.  The night before surgery wash the foot and leg well with water and the antibacterial soap provided. Be sure to pay special attention to beneath the toenails and in between the toes.  Wash for at least three (3) minutes. Rinse thoroughly with water and dry well with a towel.  Perform this wash unless told not to do so by your physician.  Enclosed: 1 Ice pack (please put in freezer the night before surgery)   1 Hibiclens skin cleaner     Pre-op instructions  If you have any questions regarding the instructions, please do not hesitate to call our office.  [place]: [address], [place], [place] [address] -- [phone]  [place]: [address] [place], [place] [address] -- [phone]  [place]: [address]  [place],  [address] -- [phone]  [place]: [address], [address], [place],  [address] -- [phone]  Website:  [email or web address] 

## 2017-07-16 NOTE — Telephone Encounter (Signed)
They need the provider to sign the job description. Also needed a date of the 6-54months, & a time frame on the appointments. The information has been completed, placed in providers box to review and sign.

## 2017-07-16 NOTE — Telephone Encounter (Signed)
signed

## 2017-07-17 NOTE — Telephone Encounter (Signed)
Forms have been faxed, copy sent to scan.  LVM informing patient the original is ready to be picked up.

## 2017-07-19 ENCOUNTER — Telehealth: Payer: Self-pay | Admitting: Emergency Medicine

## 2017-07-19 NOTE — Telephone Encounter (Signed)
Called patient to schedule AWV. Patient declined at this time. 

## 2017-07-26 ENCOUNTER — Ambulatory Visit: Payer: Medicare Other | Admitting: Podiatry

## 2017-07-31 ENCOUNTER — Telehealth: Payer: Self-pay | Admitting: Internal Medicine

## 2017-07-31 NOTE — Telephone Encounter (Signed)
Copied from [place] [id number]. Topic: Quick Communication - Rx Refill/Question >> Jul 31, 2017  3:50 PM Aurelio Brash B wrote: Medication:   rosuvastatin (CRESTOR) 40 MG tablet   venlafaxine XR (EFFEXOR-XR) 75 MG 24 hr capsule             metFORMIN (GLUCOPHAGE) 500 MG tablet   Has the patient contacted their pharmacy? No new pharmacy   (Agent: If no, request that the patient contact the pharmacy for the refill.)  Preferred Pharmacy (with phone number or street name): [place], [place] [phone] (Phone) [phone] (Fax)    Agent: Please be advised that RX refills may take up to 3 business days. We ask that you follow-up with your pharmacy.

## 2017-08-01 MED ORDER — VENLAFAXINE HCL ER 75 MG PO CP24: 75.0000 mg | ORAL_CAPSULE | Freq: Every day | ORAL | 1 refills | Status: DC

## 2017-08-01 MED ORDER — METFORMIN HCL 500 MG PO TABS: 500.0000 mg | ORAL_TABLET | Freq: Every day | ORAL | 1 refills | Status: DC

## 2017-08-01 MED ORDER — ROSUVASTATIN CALCIUM 40 MG PO TABS: 40.0000 mg | ORAL_TABLET | Freq: Every day | ORAL | 1 refills | Status: DC

## 2017-08-07 NOTE — Progress Notes (Signed)
Subjective:  Patient ID: Veronica Carrillo, female    DOB: October 25, 1945,  MRN: [id number]  Chief Complaint  Patient presents with  . Toe Pain    5th toe right - corn x years, had "removed" by a doc in [place] before she moved here, tried trimming herself, always come back and is getting more tender  . New Patient (Initial Visit)    72 y.o. female presents with the above complaint.  States that she had her right fifth toe worked on several years ago.  States that it was removed by Dr. in [place].  States that she did Try to keep it trimmed down.  States that it still very tender.  Past Medical History:  Diagnosis Date  . Anxiety   . Arthritis   . Depression   . Diabetes mellitus without complication ([place])   . Diverticulitis   . History of colon polyps   . Hyperlipidemia   . IBS (irritable bowel syndrome)    Past Surgical History:  Procedure Laterality Date  . ABDOMINAL EXPLORATION SURGERY    . ABDOMINAL HYSTERECTOMY    . BREAST BIOPSY Left   . COLONOSCOPY    . DILATION AND CURETTAGE OF UTERUS     x 3  . POLYPECTOMY    . TONSILLECTOMY      Current Outpatient Medications:  .  loperamide (IMODIUM) 2 MG capsule, Take 2-4 mg by mouth 3 (three) times daily as needed for diarrhea or loose stools., Disp: , Rfl:  .  metFORMIN (GLUCOPHAGE) 500 MG tablet, Take 1 tablet (500 mg total) by mouth daily with breakfast., Disp: 90 tablet, Rfl: 1 .  naproxen (NAPROSYN) 500 MG tablet, Take 1 tablet (500 mg total) by mouth 2 (two) times daily with a meal., Disp: 60 tablet, Rfl: 1 .  OVER THE COUNTER MEDICATION, Niacin 500 mg. One tablet daily., Disp: , Rfl:  .  OVER THE COUNTER MEDICATION, Digestive Enzyme One tablet daily., Disp: , Rfl:  .  OVER THE COUNTER MEDICATION, Super Omeg 3 EPA, one time daily., Disp: , Rfl:  .  OVER THE COUNTER MEDICATION, Glysen two tablets daily., Disp: , Rfl:  .  OVER THE COUNTER MEDICATION, Liqua- D Two drops daily., Disp: , Rfl:  .  rosuvastatin (CRESTOR) 40 MG tablet,  Take 1 tablet (40 mg total) by mouth daily., Disp: 90 tablet, Rfl: 1 .  venlafaxine XR (EFFEXOR-XR) 75 MG 24 hr capsule, Take 1 capsule (75 mg total) by mouth daily., Disp: 90 capsule, Rfl: 1  Current Facility-Administered Medications:  .  0.9 %  sodium chloride infusion, 500 mL, Intravenous, Once, Irene Shipper, MD  Allergies  Allergen Reactions  . Codeine Nausea And Vomiting   Review of Systems: Negative except as noted in the HPI. Denies N/V/F/Ch. Objective:   Vitals:   07/13/17 1517  BP: (!) 152/95  Pulse: 94  Resp: 16   General AA&O x3. Normal mood and affect.  Vascular Dorsalis pedis and posterior tibial pulses  present 2+ bilaterally  Capillary refill normal to all digits. Pedal hair growth normal.  Neurologic Epicritic sensation grossly present.  Dermatologic No open lesions. Interspaces clear of maceration. Nails well groomed and normal in appearance. Right fifth PIPJ corn  Orthopedic: MMT 5/5 in dorsiflexion, plantarflexion, inversion, and eversion. Normal joint ROM without pain or crepitus. Right fifth hammertoe adductovarus   Assessment & Plan:  Patient was evaluated and treated and all questions answered.  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  Incomplete prior resection of the fifth proximal phalanx -Would benefit from right fifth toe hammertoe correction.  Discussed all risk benefits alternatives.  Patient would like to proceed.  Return for After Surgery.

## 2017-09-25 NOTE — Patient Instructions (Signed)
  Test(s) ordered today. Your results will be released to [facility] (or called to you) after review, usually within 72hours after test completion. If any changes need to be made, you will be notified at that same time.  All other Health Maintenance issues reviewed.   All recommended immunizations and age-appropriate screenings are up-to-date or discussed.  No immunizations administered today.   Medications reviewed and updated.  Changes include  /  No changes recommended at this time.  Your prescription(s) have been submitted to your pharmacy. Please take as directed and contact our office if you believe you are having problem(s) with the medication(s).  A referral was ordered for   Please followup in

## 2017-09-25 NOTE — Progress Notes (Signed)
Subjective:    Patient ID: Veronica Carrillo, female    DOB: [birth year], 72 y.o.   MRN: [id number]  HPI The patient is here for follow up.   Medications and allergies reviewed with patient and updated if appropriate.  Patient Active Problem List   Diagnosis Date Noted  . Irritable bowel syndrome (IBS) 03/27/2017  . Acute pain of right knee 11/28/2016  . Hyperlipidemia 10/01/2015  . Diverticulitis of colon 10/01/2015  . Diabetes ([place]) 10/01/2015  . Depression 10/01/2015  . Anxiety 10/01/2015  . Arthritis of right knee 10/01/2015    Current Outpatient Medications on File Prior to Visit  Medication Sig Dispense Refill  . loperamide (IMODIUM) 2 MG capsule Take 2-4 mg by mouth 3 (three) times daily as needed for diarrhea or loose stools.    . metFORMIN (GLUCOPHAGE) 500 MG tablet Take 1 tablet (500 mg total) by mouth daily with breakfast. 90 tablet 1  . naproxen (NAPROSYN) 500 MG tablet Take 1 tablet (500 mg total) by mouth 2 (two) times daily with a meal. 60 tablet 1  . OVER THE COUNTER MEDICATION Niacin 500 mg. One tablet daily.    [place] OVER THE COUNTER MEDICATION Digestive Enzyme One tablet daily.    [place] OVER THE COUNTER MEDICATION Super Omeg 3 EPA, one time daily.    [place] Kitchen OVER THE COUNTER MEDICATION Glysen two tablets daily.    [place] Kitchen OVER THE COUNTER MEDICATION Liqua- D Two drops daily.    . rosuvastatin (CRESTOR) 40 MG tablet Take 1 tablet (40 mg total) by mouth daily. 90 tablet 1  . venlafaxine XR (EFFEXOR-XR) 75 MG 24 hr capsule Take 1 capsule (75 mg total) by mouth daily. 90 capsule 1   Current Facility-Administered Medications on File Prior to Visit  Medication Dose Route Frequency Provider Last Rate Last Dose  . 0.9 %  sodium chloride infusion  500 mL Intravenous Once Irene Shipper, MD        Past Medical History:  Diagnosis Date  . Anxiety   . Arthritis   . Depression   . Diabetes mellitus without complication ([place])   . Diverticulitis   . History of colon polyps   .  Hyperlipidemia   . IBS (irritable bowel syndrome)     Past Surgical History:  Procedure Laterality Date  . ABDOMINAL EXPLORATION SURGERY    . ABDOMINAL HYSTERECTOMY    . BREAST BIOPSY Left   . COLONOSCOPY    . DILATION AND CURETTAGE OF UTERUS     x 3  . POLYPECTOMY    . TONSILLECTOMY      Social History   Socioeconomic History  . Marital status: Single    Spouse name: Not on file  . Number of children: 1  . Years of education: Not on file  . Highest education level: Not on file  Occupational History  . Occupation: retired  . Occupation: PT recepionist  Social Needs  . Financial resource strain: Not on file  . Food insecurity:    Worry: Not on file    Inability: Not on file  . Transportation needs:    Medical: Not on file    Non-medical: Not on file  Tobacco Use  . Smoking status: Never Smoker  . Smokeless tobacco: Never Used  Substance and Sexual Activity  . Alcohol use: Yes    Alcohol/week: 0.0 oz    Comment: wine cooler-ocassional  . Drug use: No  . Sexual activity: Not on file  Lifestyle  .  Physical activity:    Days per week: Not on file    Minutes per session: Not on file  . Stress: Not on file  Relationships  . Social connections:    Talks on phone: Not on file    Gets together: Not on file    Attends religious service: Not on file    Active member of club or organization: Not on file    Attends meetings of clubs or organizations: Not on file    Relationship status: Not on file  Other Topics Concern  . Not on file  Social History Narrative   Exercise: [employer], walking      Moved from [place]    Family History  Problem Relation Age of Onset  . Hypertension Mother   . Bladder Cancer Father   . Heart disease Father   . Diabetes Paternal Grandmother   . Colon cancer Neg Hx   . Esophageal cancer Neg Hx   . Liver cancer Neg Hx   . Pancreatic cancer Neg Hx   . Rectal cancer Neg Hx   . Stomach cancer Neg Hx     Review of Systems     Objective:   There were no vitals filed for this visit. BP Readings from Last 3 Encounters:  07/13/17 (!) 152/95  06/07/17 134/68  05/10/17 132/67   Wt Readings from Last 3 Encounters:  06/07/17 215 lb (97.5 kg)  05/24/17 215 lb 6.4 oz (97.7 kg)  05/10/17 210 lb (95.3 kg)   There is no height or weight on file to calculate BMI.   Physical Exam    Constitutional: Appears well-developed and well-nourished. No distress.  HENT:  Head: Normocephalic and atraumatic.  Neck: Neck supple. No tracheal deviation present. No thyromegaly present.  No cervical lymphadenopathy Cardiovascular: Normal rate, regular rhythm and normal heart sounds.   No murmur heard. No carotid bruit .  No edema Pulmonary/Chest: Effort normal and breath sounds normal. No respiratory distress. No has no wheezes. No rales.  Skin: Skin is warm and dry. Not diaphoretic.  Psychiatric: Normal mood and affect. Behavior is normal.      Assessment & Plan:    See Problem List for Assessment and Plan of chronic medical problems.   This encounter was created in error - please disregard.

## 2017-09-26 ENCOUNTER — Encounter: Payer: Medicare Other | Admitting: Internal Medicine

## 2017-09-26 DIAGNOSIS — Z0289 Encounter for other administrative examinations: Secondary | ICD-10-CM

## 2017-10-07 NOTE — Progress Notes (Signed)
Subjective:    Patient ID: Veronica Carrillo, female    DOB: [birth year], 72 y.o.   MRN: [id number]  HPI The patient is here for follow up.  Diabetes: She is taking her medication daily as prescribed. She is compliant with a diabetic diet. She is exercising regularly - 3/week.  She checks her feet daily and denies foot lesions. She is up-to-date with an ophthalmology examination - she [place] [address] and her exam was normal.   Hyperlipidemia: She is taking her medication daily. She is compliant with a low fat/cholesterol diet. She is exercising regularly. She denies myalgias.   Depression: She is taking her medication daily as prescribed. She denies any side effects from the medication. She feels her depression is well controlled and she is happy with her current dose of medication.   Anxiety: She is taking her medication daily as prescribed. She denies any side effects from the medication. She feels her anxiety is well controlled and she is happy with her current dose of medication.   IBS with diarrhea:  She takes imodium daily and that controls her symptoms.    Medications and allergies reviewed with patient and updated if appropriate.  Patient Active Problem List   Diagnosis Date Noted  . Irritable bowel syndrome (IBS) 03/27/2017  . Acute pain of right knee 11/28/2016  . Hyperlipidemia 10/01/2015  . Diverticulitis of colon 10/01/2015  . Diabetes ([place]) 10/01/2015  . Depression 10/01/2015  . Anxiety 10/01/2015  . Arthritis of right knee 10/01/2015    Current Outpatient Medications on File Prior to Visit  Medication Sig Dispense Refill  . loperamide (IMODIUM) 2 MG capsule Take 2-4 mg by mouth 3 (three) times daily as needed for diarrhea or loose stools.    . metFORMIN (GLUCOPHAGE) 500 MG tablet Take 1 tablet (500 mg total) by mouth daily with breakfast. 90 tablet 1  . naproxen (NAPROSYN) 500 MG tablet Take 1 tablet (500 mg total) by mouth 2 (two) times daily with a  meal. 60 tablet 1  . OVER THE COUNTER MEDICATION Niacin 500 mg. One tablet daily.    Marland Kitchen OVER THE COUNTER MEDICATION Digestive Enzyme One tablet daily.    Marland Kitchen OVER THE COUNTER MEDICATION Super Omeg 3 EPA, one time daily.    Marland Kitchen OVER THE COUNTER MEDICATION Glysen two tablets daily.    Marland Kitchen OVER THE COUNTER MEDICATION Liqua- D Two drops daily.    . rosuvastatin (CRESTOR) 40 MG tablet Take 1 tablet (40 mg total) by mouth daily. 90 tablet 1  . venlafaxine XR (EFFEXOR-XR) 75 MG 24 hr capsule Take 1 capsule (75 mg total) by mouth daily. 90 capsule 1   No current facility-administered medications on file prior to visit.     Past Medical History:  Diagnosis Date  . Anxiety   . Arthritis   . Depression   . Diabetes mellitus without complication ([place])   . Diverticulitis   . History of colon polyps   . Hyperlipidemia   . IBS (irritable bowel syndrome)     Past Surgical History:  Procedure Laterality Date  . ABDOMINAL EXPLORATION SURGERY    . ABDOMINAL HYSTERECTOMY    . BREAST BIOPSY Left   . COLONOSCOPY    . DILATION AND CURETTAGE OF UTERUS     x 3  . POLYPECTOMY    . TONSILLECTOMY      Social History   Socioeconomic History  . Marital status: Single    Spouse name: Not  on file  . Number of children: 1  . Years of education: Not on file  . Highest education level: Not on file  Occupational History  . Occupation: retired  . Occupation: PT recepionist  Social Needs  . Financial resource strain: Not on file  . Food insecurity:    Worry: Not on file    Inability: Not on file  . Transportation needs:    Medical: Not on file    Non-medical: Not on file  Tobacco Use  . Smoking status: Never Smoker  . Smokeless tobacco: Never Used  Substance and Sexual Activity  . Alcohol use: Yes    Alcohol/week: 0.0 oz    Comment: wine cooler-ocassional  . Drug use: No  . Sexual activity: Not on file  Lifestyle  . Physical activity:    Days per week: Not on file    Minutes per session: Not on  file  . Stress: Not on file  Relationships  . Social connections:    Talks on phone: Not on file    Gets together: Not on file    Attends religious service: Not on file    Active member of club or organization: Not on file    Attends meetings of clubs or organizations: Not on file    Relationship status: Not on file  Other Topics Concern  . Not on file  Social History Narrative   Exercise: [employer], walking      Moved from [place]    Family History  Problem Relation Age of Onset  . Hypertension Mother   . Bladder Cancer Father   . Heart disease Father   . Diabetes Paternal Grandmother   . Colon cancer Neg Hx   . Esophageal cancer Neg Hx   . Liver cancer Neg Hx   . Pancreatic cancer Neg Hx   . Rectal cancer Neg Hx   . Stomach cancer Neg Hx     Review of Systems  Constitutional: Negative for chills and fever.  Respiratory: Negative for cough, shortness of breath and wheezing.   Cardiovascular: Negative for palpitations and leg swelling.  Gastrointestinal:       No gerd  Neurological: Negative for dizziness, light-headedness and headaches.  Psychiatric/Behavioral: Positive for dysphoric mood (controlled). Negative for sleep disturbance. The patient is nervous/anxious (controlled).        Objective:   Vitals:   10/08/17 0931  BP: (!) 158/88  Pulse: (!) 104  Resp: 16  Temp: 98.1 F (36.7 C)  SpO2: 97%   BP Readings from Last 3 Encounters:  10/08/17 (!) 158/88  07/13/17 (!) 152/95  06/07/17 134/68   Wt Readings from Last 3 Encounters:  10/08/17 211 lb (95.7 kg)  06/07/17 215 lb (97.5 kg)  05/24/17 215 lb 6.4 oz (97.7 kg)   Body mass index is 37.38 kg/m.   Physical Exam    Constitutional: Appears well-developed and well-nourished. No distress.  HENT:  Head: Normocephalic and atraumatic.  Neck: Neck supple. No tracheal deviation present. No thyromegaly present.  No cervical lymphadenopathy Cardiovascular: Normal rate, regular rhythm and normal heart sounds.     No murmur heard. No carotid bruit .  No edema Pulmonary/Chest: Effort normal and breath sounds normal. No respiratory distress. No has no wheezes. No rales.  Skin: Skin is warm and dry. Not diaphoretic.  Psychiatric: Normal mood and affect. Behavior is normal.    Diabetic Foot Exam - Simple   Simple Foot Form Diabetic Foot exam was performed with the following findings:  Yes 10/08/2017  9:50 AM  Visual Inspection No deformities, no ulcerations, no other skin breakdown bilaterally:  Yes Sensation Testing Intact to touch and monofilament testing bilaterally:  Yes Pulse Check Posterior Tibialis and Dorsalis pulse intact bilaterally:  Yes Comments       Assessment & Plan:    See Problem List for Assessment and Plan of chronic medical problems.

## 2017-10-08 ENCOUNTER — Other Ambulatory Visit (INDEPENDENT_AMBULATORY_CARE_PROVIDER_SITE_OTHER): Payer: Managed Care, Other (non HMO)

## 2017-10-08 ENCOUNTER — Ambulatory Visit (INDEPENDENT_AMBULATORY_CARE_PROVIDER_SITE_OTHER): Payer: Managed Care, Other (non HMO) | Admitting: Internal Medicine

## 2017-10-08 ENCOUNTER — Encounter: Payer: Self-pay | Admitting: Internal Medicine

## 2017-10-08 VITALS — BP 158/88 | HR 104 | Temp 98.1°F | Resp 16 | Wt 211.0 lb

## 2017-10-08 DIAGNOSIS — F329 Major depressive disorder, single episode, unspecified: Secondary | ICD-10-CM

## 2017-10-08 DIAGNOSIS — E7849 Other hyperlipidemia: Secondary | ICD-10-CM

## 2017-10-08 DIAGNOSIS — K580 Irritable bowel syndrome with diarrhea: Secondary | ICD-10-CM | POA: Diagnosis not present

## 2017-10-08 DIAGNOSIS — F419 Anxiety disorder, unspecified: Secondary | ICD-10-CM | POA: Diagnosis not present

## 2017-10-08 DIAGNOSIS — F32A Depression, unspecified: Secondary | ICD-10-CM

## 2017-10-08 DIAGNOSIS — E119 Type 2 diabetes mellitus without complications: Secondary | ICD-10-CM | POA: Diagnosis not present

## 2017-10-08 LAB — COMPREHENSIVE METABOLIC PANEL
ALT: 19 U/L (ref 0–35)
AST: 22 U/L (ref 0–37)
Albumin: 4.2 g/dL (ref 3.5–5.2)
Alkaline Phosphatase: 99 U/L (ref 39–117)
BILIRUBIN TOTAL: 1.2 mg/dL (ref 0.2–1.2)
BUN: 11 mg/dL (ref 6–23)
CO2: 31 meq/L (ref 19–32)
Calcium: 9.4 mg/dL (ref 8.4–10.5)
Chloride: 104 mEq/L (ref 96–112)
Creatinine, Ser: 1.14 mg/dL (ref 0.40–1.20)
GFR: 60.4 mL/min (ref >60.00)
GLUCOSE: 148 mg/dL — AB (ref 70–99)
POTASSIUM: 4.1 meq/L (ref 3.5–5.1)
Sodium: 141 mEq/L (ref 135–145)
Total Protein: 7.3 g/dL (ref 6.0–8.3)

## 2017-10-08 LAB — LIPID PANEL
CHOL/HDL RATIO: 3
Cholesterol: 170 mg/dL (ref 0–200)
HDL: 50.5 mg/dL (ref >39.00)
LDL Cholesterol: 86 mg/dL (ref 0–99)
NONHDL: 119.91
Triglycerides: 171 mg/dL — ABNORMAL HIGH (ref 0.0–149.0)
VLDL: 34.2 mg/dL (ref 0.0–40.0)

## 2017-10-08 LAB — HEMOGLOBIN A1C: Hgb A1c MFr Bld: 6.4 % (ref 4.6–6.5)

## 2017-10-08 LAB — MICROALBUMIN / CREATININE URINE RATIO
Creatinine,U: 331.9 mg/dL
Microalb Creat Ratio: 3.9 mg/g (ref 0.0–30.0)
Microalb, Ur: 13 mg/dL — ABNORMAL HIGH (ref 0.0–1.9)

## 2017-10-08 MED ORDER — LOPERAMIDE HCL 2 MG PO CAPS: 2.0000 mg | ORAL_CAPSULE | Freq: Every day | ORAL | 3 refills | Status: DC

## 2017-10-08 NOTE — Assessment & Plan Note (Signed)
Check lipid panel  Continue daily statin Regular exercise and healthy diet encouraged  

## 2017-10-08 NOTE — Assessment & Plan Note (Signed)
Controlled, stable Continue current dose of medication  

## 2017-10-08 NOTE — Patient Instructions (Signed)
  Test(s) ordered today. Your results will be released to MyChart (or called to you) after review, usually within 72hours after test completion. If any changes need to be made, you will be notified at that same time.  Medications reviewed and updated.  No changes recommended at this time.    Please followup in 6 months   

## 2017-10-08 NOTE — Assessment & Plan Note (Signed)
Check a1c, urine micro Low sugar / carb diet Stressed regular exercise

## 2017-10-08 NOTE — Assessment & Plan Note (Signed)
Takes one imodium daily - controlled

## 2017-10-09 ENCOUNTER — Encounter: Payer: Self-pay | Admitting: Internal Medicine

## 2018-01-19 ENCOUNTER — Other Ambulatory Visit: Payer: Self-pay | Admitting: Internal Medicine

## 2018-01-29 ENCOUNTER — Ambulatory Visit (INDEPENDENT_AMBULATORY_CARE_PROVIDER_SITE_OTHER): Payer: Managed Care, Other (non HMO)

## 2018-01-29 DIAGNOSIS — Z299 Encounter for prophylactic measures, unspecified: Secondary | ICD-10-CM

## 2018-01-29 DIAGNOSIS — Z23 Encounter for immunization: Secondary | ICD-10-CM | POA: Diagnosis not present

## 2018-01-29 NOTE — Progress Notes (Signed)
Subjective:    Patient ID: Veronica Carrillo, female    DOB: Jun 02, 1945, 72 y.o.   MRN: [id number]  HPI The patient is here for an acute visit.   IBS: She has a long history of IBS.  She gets abdominal cramping and diarrhea.  She tries to avoid the foods that she now causes her symptoms.  Stress is also a trigger.  She can get up to 2-3 episodes a week.  She will experience fecal incontinence along with urinary incontinence.  She has FMLA for this and needs her paperwork filled out to show that this is intermittent problem.  She denies any fevers, chills, blood in the stool, nausea.  She has seen GI for the symptoms and it was confirmed that was IBS.  She does get frustrated by her urinary incontinence and incontinence.  She does wear a pad.  Elevated blood pressure: Her blood pressure has been elevated on more than one occasion.  She is currently not exercising regularly and is not compliant with a low-sodium diet.  She has not lost any weight since she was here last.  She is not currently monitoring her blood pressure at home.  She denies any chest pain, palpitations, shortness of breath, headaches and lightheadedness.  Medications and allergies reviewed with patient and updated if appropriate.  Patient Active Problem List   Diagnosis Date Noted  . Elevated blood pressure reading 01/30/2018  . Irritable bowel syndrome (IBS) 03/27/2017  . Acute pain of right knee 11/28/2016  . Hyperlipidemia 10/01/2015  . Diverticulitis of colon 10/01/2015  . Diabetes ([place]) 10/01/2015  . Depression 10/01/2015  . Anxiety 10/01/2015  . Arthritis of right knee 10/01/2015    Current Outpatient Medications on File Prior to Visit  Medication Sig Dispense Refill  . loperamide (IMODIUM) 2 MG capsule Take 1 capsule (2 mg total) by mouth daily. 90 capsule 3  . metFORMIN (GLUCOPHAGE) 500 MG tablet TAKE 1 TABLET BY MOUTH  DAILY WITH BREAKFAST 90 tablet 1  . naproxen (NAPROSYN) 500 MG tablet Take 1 tablet  (500 mg total) by mouth 2 (two) times daily with a meal. 60 tablet 1  . OVER THE COUNTER MEDICATION Niacin 500 mg. One tablet daily.    Marland Kitchen OVER THE COUNTER MEDICATION Digestive Enzyme One tablet daily.    Marland Kitchen OVER THE COUNTER MEDICATION Super Omeg 3 EPA, one time daily.    Marland Kitchen OVER THE COUNTER MEDICATION Glysen two tablets daily.    Marland Kitchen OVER THE COUNTER MEDICATION Liqua- D Two drops daily.    . rosuvastatin (CRESTOR) 40 MG tablet TAKE 1 TABLET BY MOUTH  DAILY 90 tablet 1  . venlafaxine XR (EFFEXOR-XR) 75 MG 24 hr capsule TAKE 1 CAPSULE BY MOUTH  DAILY 90 capsule 1   No current facility-administered medications on file prior to visit.     Past Medical History:  Diagnosis Date  . Anxiety   . Arthritis   . Depression   . Diabetes mellitus without complication ([place])   . Diverticulitis   . History of colon polyps   . Hyperlipidemia   . IBS (irritable bowel syndrome)     Past Surgical History:  Procedure Laterality Date  . ABDOMINAL EXPLORATION SURGERY    . ABDOMINAL HYSTERECTOMY    . BREAST BIOPSY Left   . COLONOSCOPY    . DILATION AND CURETTAGE OF UTERUS     x 3  . POLYPECTOMY    . TONSILLECTOMY      Social History  Socioeconomic History  . Marital status: Single    Spouse name: Not on file  . Number of children: 1  . Years of education: Not on file  . Highest education level: Not on file  Occupational History  . Occupation: retired  . Occupation: PT recepionist  Social Needs  . Financial resource strain: Not on file  . Food insecurity:    Worry: Not on file    Inability: Not on file  . Transportation needs:    Medical: Not on file    Non-medical: Not on file  Tobacco Use  . Smoking status: Never Smoker  . Smokeless tobacco: Never Used  Substance and Sexual Activity  . Alcohol use: Yes    Alcohol/week: 0.0 standard drinks    Comment: wine cooler-ocassional  . Drug use: No  . Sexual activity: Not on file  Lifestyle  . Physical activity:    Days per week: Not on  file    Minutes per session: Not on file  . Stress: Not on file  Relationships  . Social connections:    Talks on phone: Not on file    Gets together: Not on file    Attends religious service: Not on file    Active member of club or organization: Not on file    Attends meetings of clubs or organizations: Not on file    Relationship status: Not on file  Other Topics Concern  . Not on file  Social History Narrative   Exercise: [employer], walking      Moved from [place]    Family History  Problem Relation Age of Onset  . Hypertension Mother   . Bladder Cancer Father   . Heart disease Father   . Diabetes Paternal Grandmother   . Colon cancer Neg Hx   . Esophageal cancer Neg Hx   . Liver cancer Neg Hx   . Pancreatic cancer Neg Hx   . Rectal cancer Neg Hx   . Stomach cancer Neg Hx     Review of Systems  Constitutional: Negative for chills and fever.  Respiratory: Negative for shortness of breath.   Cardiovascular: Negative for chest pain, palpitations and leg swelling.  Gastrointestinal: Positive for abdominal pain (intermittent) and diarrhea (intermittent). Negative for blood in stool, constipation and nausea.  Neurological: Negative for light-headedness and headaches.       Objective:   Vitals:   01/30/18 0850  BP: (!) 146/88  Pulse: 83  Resp: 16  Temp: 98.2 F (36.8 C)  SpO2: 97%   BP Readings from Last 3 Encounters:  01/30/18 (!) 146/88  10/08/17 (!) 158/88  07/13/17 (!) 152/95   Wt Readings from Last 3 Encounters:  01/30/18 217 lb 12.8 oz (98.8 kg)  10/08/17 211 lb (95.7 kg)  06/07/17 215 lb (97.5 kg)   Body mass index is 38.58 kg/m.   Physical Exam  Constitutional: She appears well-developed and well-nourished. No distress.  HENT:  Head: Normocephalic and atraumatic.  Cardiovascular: Normal rate and regular rhythm.  No murmur heard. Pulmonary/Chest: Effort normal and breath sounds normal. She has no wheezes. She has no rales.  Abdominal: Soft. She  exhibits no distension. There is no tenderness.  Musculoskeletal: She exhibits no edema.  Skin: Skin is warm and dry. She is not diaphoretic.           Assessment & Plan:    See Problem List for Assessment and Plan of chronic medical problems.

## 2018-01-30 ENCOUNTER — Ambulatory Visit (INDEPENDENT_AMBULATORY_CARE_PROVIDER_SITE_OTHER): Payer: Managed Care, Other (non HMO) | Admitting: Internal Medicine

## 2018-01-30 ENCOUNTER — Encounter: Payer: Self-pay | Admitting: Internal Medicine

## 2018-01-30 ENCOUNTER — Telehealth: Payer: Self-pay

## 2018-01-30 ENCOUNTER — Other Ambulatory Visit: Payer: Self-pay | Admitting: Internal Medicine

## 2018-01-30 VITALS — BP 146/88 | HR 83 | Temp 98.2°F | Resp 16 | Ht 63.0 in | Wt 217.8 lb

## 2018-01-30 DIAGNOSIS — R030 Elevated blood-pressure reading, without diagnosis of hypertension: Secondary | ICD-10-CM | POA: Diagnosis not present

## 2018-01-30 DIAGNOSIS — K580 Irritable bowel syndrome with diarrhea: Secondary | ICD-10-CM

## 2018-01-30 DIAGNOSIS — N3946 Mixed incontinence: Secondary | ICD-10-CM | POA: Insufficient documentation

## 2018-01-30 DIAGNOSIS — R32 Unspecified urinary incontinence: Secondary | ICD-10-CM | POA: Insufficient documentation

## 2018-01-30 DIAGNOSIS — Z1231 Encounter for screening mammogram for malignant neoplasm of breast: Secondary | ICD-10-CM

## 2018-01-30 NOTE — Assessment & Plan Note (Signed)
She has both urinary and fecal incontinence at times.  Her fecal incontinence is typically associated with her IBS symptoms She has never done pelvic PT and I will refer her today

## 2018-01-30 NOTE — Assessment & Plan Note (Signed)
Has intermittent abdominal cramping and diarrhea Takes Imodium daily and as needed Requires FMLA sit if she needs to miss work or is late to work she is covered Avoids food that cause of the symptoms Tries to keep her stress level down We will complete FMLA paperwork

## 2018-01-30 NOTE — Telephone Encounter (Signed)
FMLA has been completed & placed in providers box to review and sign.

## 2018-01-30 NOTE — Assessment & Plan Note (Signed)
Blood pressure elevated on multiple occasions She does not want to start new medication Stressed lifestyle changes-start regular exercise, decrease sodium intake and work on weight loss Start monitoring blood pressure At her next appointment if her blood pressure is still elevated we need to start medication

## 2018-01-30 NOTE — Telephone Encounter (Signed)
Thi is not her chart - may be a duplicate

## 2018-01-30 NOTE — Patient Instructions (Signed)
Your goal BP is < 140/90.   Monitor your bp and work on weight loss.  We need to consider starting a blood pressure medication.      Hypertension Hypertension, commonly called high blood pressure, is when the force of blood pumping through the arteries is too strong. The arteries are the blood vessels that carry blood from the heart throughout the body. Hypertension forces the heart to work harder to pump blood and may cause arteries to become narrow or stiff. Having untreated or uncontrolled hypertension can cause heart attacks, strokes, kidney disease, and other problems. A blood pressure reading consists of a higher number over a lower number. Ideally, your blood pressure should be below 120/80. The first ("top") number is called the systolic pressure. It is a measure of the pressure in your arteries as your heart beats. The second ("bottom") number is called the diastolic pressure. It is a measure of the pressure in your arteries as the heart relaxes. What are the causes? The cause of this condition is not known. What increases the risk? Some risk factors for high blood pressure are under your control. Others are not. Factors you can change  Smoking.  Having type 2 diabetes mellitus, high cholesterol, or both.  Not getting enough exercise or physical activity.  Being overweight.  Having too much fat, sugar, calories, or salt (sodium) in your diet.  Drinking too much alcohol. Factors that are difficult or impossible to change  Having chronic kidney disease.  Having a family history of high blood pressure.  Age. Risk increases with age.  Race. You may be at higher risk if you are African-American.  Gender. Men are at higher risk than women before age 49. After age 7, women are at higher risk than men.  Having obstructive sleep apnea.  Stress. What are the signs or symptoms? Extremely high blood pressure (hypertensive crisis) may  cause:  Headache.  Anxiety.  Shortness of breath.  Nosebleed.  Nausea and vomiting.  Severe chest pain.  Jerky movements you cannot control (seizures).  How is this diagnosed? This condition is diagnosed by measuring your blood pressure while you are seated, with your arm resting on a surface. The cuff of the blood pressure monitor will be placed directly against the skin of your upper arm at the level of your heart. It should be measured at least twice using the same arm. Certain conditions can cause a difference in blood pressure between your right and left arms. Certain factors can cause blood pressure readings to be lower or higher than normal (elevated) for a short period of time:  When your blood pressure is higher when you are in a health care provider's office than when you are at home, this is called white coat hypertension. Most people with this condition do not need medicines.  When your blood pressure is higher at home than when you are in a health care provider's office, this is called masked hypertension. Most people with this condition may need medicines to control blood pressure.  If you have a high blood pressure reading during one visit or you have normal blood pressure with other risk factors:  You may be asked to return on a different day to have your blood pressure checked again.  You may be asked to monitor your blood pressure at home for 1 week or longer.  If you are diagnosed with hypertension, you may have other blood or imaging tests to help your health care provider understand your  overall risk for other conditions. How is this treated? This condition is treated by making healthy lifestyle changes, such as eating healthy foods, exercising more, and reducing your alcohol intake. Your health care provider may prescribe medicine if lifestyle changes are not enough to get your blood pressure under control, and if:  Your systolic blood pressure is above  130.  Your diastolic blood pressure is above 80.  Your personal target blood pressure may vary depending on your medical conditions, your age, and other factors. Follow these instructions at home: Eating and drinking  Eat a diet that is high in fiber and potassium, and low in sodium, added sugar, and fat. An example eating plan is called the DASH (Dietary Approaches to Stop Hypertension) diet. To eat this way: ? Eat plenty of fresh fruits and vegetables. Try to fill half of your plate at each meal with fruits and vegetables. ? Eat whole grains, such as whole wheat pasta, brown rice, or whole grain bread. Fill about one quarter of your plate with whole grains. ? Eat or drink low-fat dairy products, such as skim milk or low-fat yogurt. ? Avoid fatty cuts of meat, processed or cured meats, and poultry with skin. Fill about one quarter of your plate with lean proteins, such as fish, chicken without skin, beans, eggs, and tofu. ? Avoid premade and processed foods. These tend to be higher in sodium, added sugar, and fat.  Reduce your daily sodium intake. Most people with hypertension should eat less than 1,500 mg of sodium a day.  Limit alcohol intake to no more than 1 drink a day for nonpregnant women and 2 drinks a day for men. One drink equals 12 oz of beer, 5 oz of wine, or 1 oz of hard liquor. Lifestyle  Work with your health care provider to maintain a healthy body weight or to lose weight. Ask what an ideal weight is for you.  Get at least 30 minutes of exercise that causes your heart to beat faster (aerobic exercise) most days of the week. Activities may include walking, swimming, or biking.  Include exercise to strengthen your muscles (resistance exercise), such as pilates or lifting weights, as part of your weekly exercise routine. Try to do these types of exercises for 30 minutes at least 3 days a week.  Do not use any products that contain nicotine or tobacco, such as cigarettes and  e-cigarettes. If you need help quitting, ask your health care provider.  Monitor your blood pressure at home as told by your health care provider.  Keep all follow-up visits as told by your health care provider. This is important. Medicines  Take over-the-counter and prescription medicines only as told by your health care provider. Follow directions carefully. Blood pressure medicines must be taken as prescribed.  Do not skip doses of blood pressure medicine. Doing this puts you at risk for problems and can make the medicine less effective.  Ask your health care provider about side effects or reactions to medicines that you should watch for. Contact a health care provider if:  You think you are having a reaction to a medicine you are taking.  You have headaches that keep coming back (recurring).  You feel dizzy.  You have swelling in your ankles.  You have trouble with your vision. Get help right away if:  You develop a severe headache or confusion.  You have unusual weakness or numbness.  You feel faint.  You have severe pain in your chest or  abdomen.  You vomit repeatedly.  You have trouble breathing. Summary  Hypertension is when the force of blood pumping through your arteries is too strong. If this condition is not controlled, it may put you at risk for serious complications.  Your personal target blood pressure may vary depending on your medical conditions, your age, and other factors. For most people, a normal blood pressure is less than 120/80.  Hypertension is treated with lifestyle changes, medicines, or a combination of both. Lifestyle changes include weight loss, eating a healthy, low-sodium diet, exercising more, and limiting alcohol. This information is not intended to replace advice given to you by your health care provider. Make sure you discuss any questions you have with your health care provider. Document Released: 03/27/2005 Document Revised: 02/23/2016  Document Reviewed: 02/23/2016 Elsevier Interactive Patient Education  [employer].

## 2018-01-30 NOTE — Telephone Encounter (Signed)
This is the fax number for the pt I gave the FMLA forms to you this morning.

## 2018-01-30 NOTE — Telephone Encounter (Signed)
Copied from [place] [id number]. Topic: General - Other >> Jan 30, 2018 10:13 AM Yvette Rack wrote: Reason for CRM: pt calling wanting to give Dr Quay Burow assistant her fax number [phone]

## 2018-01-31 DIAGNOSIS — Z0279 Encounter for issue of other medical certificate: Secondary | ICD-10-CM

## 2018-01-31 NOTE — Telephone Encounter (Signed)
Signed, Faxed, Copy to scan, &charged for.   LVM to inform patient the original is ready to be picked up.

## 2018-03-12 ENCOUNTER — Ambulatory Visit: Payer: Managed Care, Other (non HMO)

## 2018-03-12 ENCOUNTER — Encounter: Payer: Self-pay | Admitting: Internal Medicine

## 2018-03-13 ENCOUNTER — Ambulatory Visit
Admission: RE | Admit: 2018-03-13 | Discharge: 2018-03-13 | Disposition: A | Discharge status: Home / Self Care | Payer: Managed Care, Other (non HMO) | Source: Ambulatory Visit | Attending: Internal Medicine | Admitting: Internal Medicine

## 2018-03-13 DIAGNOSIS — Z1231 Encounter for screening mammogram for malignant neoplasm of breast: Secondary | ICD-10-CM

## 2018-03-29 ENCOUNTER — Encounter: Payer: Self-pay | Admitting: Podiatry

## 2018-03-29 ENCOUNTER — Ambulatory Visit (INDEPENDENT_AMBULATORY_CARE_PROVIDER_SITE_OTHER): Payer: Managed Care, Other (non HMO) | Admitting: Podiatry

## 2018-03-29 ENCOUNTER — Ambulatory Visit (INDEPENDENT_AMBULATORY_CARE_PROVIDER_SITE_OTHER): Payer: Managed Care, Other (non HMO)

## 2018-03-29 DIAGNOSIS — M2041 Other hammer toe(s) (acquired), right foot: Secondary | ICD-10-CM | POA: Diagnosis not present

## 2018-03-30 NOTE — Progress Notes (Signed)
Subjective:   Patient ID: Veronica Carrillo, female   DOB: 72 y.o.   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

## 2018-04-11 ENCOUNTER — Telehealth: Payer: Self-pay

## 2018-04-11 NOTE — Telephone Encounter (Signed)
Copied from [place] [id number]. Topic: General - Other >> Apr 11, 2018 11:01 AM Antonieta Iba C wrote: Reason for CRM: pt called in to schedule her shingles injection. >> Apr 11, 2018 11:24 AM Morphies, Isidoro Donning wrote: Faythe Ghee to schedule nurse visit?

## 2018-04-11 NOTE — Telephone Encounter (Signed)
Called patient and informed. She states that she does not want to get it at the pharmacy and does not care if insurance will not cover it. She will pay out of pocket for the injection. Patient is scheduled to come Friday, January 10th.

## 2018-04-11 NOTE — Telephone Encounter (Signed)
Medicare patients will only have coverage getting the injection at the pharmacy. It will not be covered here with medicare.

## 2018-04-19 ENCOUNTER — Ambulatory Visit: Payer: Managed Care, Other (non HMO)

## 2018-05-30 ENCOUNTER — Ambulatory Visit (INDEPENDENT_AMBULATORY_CARE_PROVIDER_SITE_OTHER): Payer: Managed Care, Other (non HMO) | Admitting: Internal Medicine

## 2018-05-30 ENCOUNTER — Encounter: Payer: Self-pay | Admitting: Internal Medicine

## 2018-05-30 VITALS — BP 130/80 | HR 107 | Temp 99.2°F | Ht 63.0 in | Wt 211.0 lb

## 2018-05-30 DIAGNOSIS — R6889 Other general symptoms and signs: Secondary | ICD-10-CM | POA: Diagnosis not present

## 2018-05-30 LAB — POC INFLUENZA A&B (BINAX/QUICKVUE)
Influenza A, POC: NEGATIVE
Influenza B, POC: NEGATIVE

## 2018-05-30 MED ORDER — BENZONATATE 200 MG PO CAPS: 200.0000 mg | ORAL_CAPSULE | Freq: Three times a day (TID) | ORAL | 0 refills | Status: DC | PRN

## 2018-05-30 NOTE — Progress Notes (Signed)
   Subjective:   Patient ID: Veronica Carrillo, female    DOB: 10-12-1945, 73 y.o.   MRN: [id number]  HPI The patient is a 73 y.o. female coming in for cold symptoms. Started 3 days ago. Main symptoms are: sore throat, drainage, headaches, cough, fatigue, achiness. Denies sick contacts, SOB, chest pain. Overall it is worsening. Has tried nothing.   Review of Systems  Constitutional: Positive for activity change, appetite change and chills. Negative for fatigue, fever and unexpected weight change.  HENT: Positive for congestion, postnasal drip, rhinorrhea and sinus pressure. Negative for ear discharge, ear pain, sinus pain, sneezing, sore throat, tinnitus, trouble swallowing and voice change.   Eyes: Negative.   Respiratory: Positive for cough. Negative for chest tightness, shortness of breath and wheezing.   Cardiovascular: Negative.   Gastrointestinal: Negative.   Musculoskeletal: Positive for myalgias.  Neurological: Negative.     Objective:  Physical Exam Constitutional:      Appearance: She is well-developed.  HENT:     Head: Normocephalic and atraumatic.     Comments: Oropharynx with redness and clear drainage, nose with swollen turbinates, TMs normal bilaterally.  Neck:     Musculoskeletal: Normal range of motion.     Thyroid: No thyromegaly.  Cardiovascular:     Rate and Rhythm: Normal rate and regular rhythm.  Pulmonary:     Effort: Pulmonary effort is normal. No respiratory distress.     Breath sounds: Normal breath sounds. No wheezing or rales.  Abdominal:     Palpations: Abdomen is soft.  Musculoskeletal:        General: Tenderness present.  Lymphadenopathy:     Cervical: No cervical adenopathy.  Skin:    General: Skin is warm and dry.  Neurological:     Mental Status: She is alert and oriented to person, place, and time.     Vitals:   05/30/18 0929  BP: 130/80  Pulse: (!) 107  Temp: 99.2 F (37.3 C)  TempSrc: Oral  SpO2: 99%  Weight: 211 lb (95.7  kg)  Height: 5\' 3"  (1.6 m)    Assessment & Plan:

## 2018-05-30 NOTE — Assessment & Plan Note (Signed)
Flu test done and negative. Rx for tessalon perles for cough. Advised zyrtec and tylenol and rest.

## 2018-05-30 NOTE — Patient Instructions (Signed)
We have sent in a cough medicine to use up to 3 times per day for cough.  Try tylenol for the aches and chills.   Start taking zyrtec (cetirizine) to help with congestion.    Upper Respiratory Infection, Adult An upper respiratory infection (URI) affects the nose, throat, and upper air passages. URIs are caused by germs (viruses). The most common type of URI is often called "the common cold." Medicines cannot cure URIs, but you can do things at home to relieve your symptoms. URIs usually get better within 7-10 days. Follow these instructions at home: Activity  Rest as needed.  If you have a fever, stay home from work or school until your fever is gone, or until your doctor says you may return to work or school. ? You should stay home until you cannot spread the infection anymore (you are not contagious). ? Your doctor may have you wear a face mask so you have less risk of spreading the infection. Relieving symptoms  Gargle with a salt-water mixture 3-4 times a day or as needed. To make a salt-water mixture, completely dissolve -1 tsp of salt in 1 cup of warm water.  Use a cool-mist humidifier to add moisture to the air. This can help you breathe more easily. Eating and drinking   Drink enough fluid to keep your pee (urine) pale yellow.  Eat soups and other clear broths. General instructions   Take over-the-counter and prescription medicines only as told by your doctor. These include cold medicines, fever reducers, and cough suppressants.  Do not use any products that contain nicotine or tobacco. These include cigarettes and e-cigarettes. If you need help quitting, ask your doctor.  Avoid being where people are smoking (avoid secondhand smoke).  Make sure you get regular shots and get the flu shot every year.  Keep all follow-up visits as told by your doctor. This is important. How to avoid spreading infection to others   Wash your hands often with soap and water. If you  do not have soap and water, use hand sanitizer.  Avoid touching your mouth, face, eyes, or nose.  Cough or sneeze into a tissue or your sleeve or elbow. Do not cough or sneeze into your hand or into the air. Contact a doctor if:  You are getting worse, not better.  You have any of these: ? A fever. ? Chills. ? Brown or red mucus in your nose. ? Yellow or brown fluid (discharge)coming from your nose. ? Pain in your face, especially when you bend forward. ? Swollen neck glands. ? Pain with swallowing. ? White areas in the back of your throat. Get help right away if:  You have shortness of breath that gets worse.  You have very bad or constant: ? Headache. ? Ear pain. ? Pain in your forehead, behind your eyes, and over your cheekbones (sinus pain). ? Chest pain.  You have long-lasting (chronic) lung disease along with any of these: ? Wheezing. ? Long-lasting cough. ? Coughing up blood. ? A change in your usual mucus.  You have a stiff neck.  You have changes in your: ? Vision. ? Hearing. ? Thinking. ? Mood. Summary  An upper respiratory infection (URI) is caused by a germ called a virus. The most common type of URI is often called "the common cold."  URIs usually get better within 7-10 days.  Take over-the-counter and prescription medicines only as told by your doctor. This information is not intended to replace advice  given to you by your health care provider. Make sure you discuss any questions you have with your health care provider. Document Released: 09/13/2007 Document Revised: 11/17/2016 Document Reviewed: 11/17/2016 Elsevier Interactive Patient Education  2019 [employer].

## 2018-07-24 ENCOUNTER — Encounter: Payer: Self-pay | Admitting: Internal Medicine

## 2018-08-08 ENCOUNTER — Ambulatory Visit: Payer: Medicare Other | Admitting: Podiatry

## 2018-08-14 ENCOUNTER — Other Ambulatory Visit: Payer: Self-pay

## 2018-08-14 ENCOUNTER — Ambulatory Visit (INDEPENDENT_AMBULATORY_CARE_PROVIDER_SITE_OTHER): Payer: Managed Care, Other (non HMO) | Admitting: Podiatry

## 2018-08-14 ENCOUNTER — Encounter: Payer: Self-pay | Admitting: Podiatry

## 2018-08-14 ENCOUNTER — Ambulatory Visit (INDEPENDENT_AMBULATORY_CARE_PROVIDER_SITE_OTHER): Payer: Managed Care, Other (non HMO)

## 2018-08-14 DIAGNOSIS — M2041 Other hammer toe(s) (acquired), right foot: Secondary | ICD-10-CM | POA: Diagnosis not present

## 2018-08-14 DIAGNOSIS — M779 Enthesopathy, unspecified: Secondary | ICD-10-CM | POA: Diagnosis not present

## 2018-08-14 MED ORDER — TRIAMCINOLONE ACETONIDE 10 MG/ML IJ SUSP
10.0000 mg | Freq: Once | INTRAMUSCULAR | Status: AC
  Administered 2018-08-14: 10 mg

## 2018-08-14 NOTE — Progress Notes (Signed)
Subjective:   Patient ID: Veronica Carrillo, female   DOB: 73 y.o.   MRN: [id number]   HPI Patient states this toe has started to hurt again quite a bit and I will probably end up requiring surgery.  Patient does need to go out of town and will probably do it when she gets back   ROS      Objective:  Physical Exam  Neurovascular status intact with digital deformity digit 5 right with keratotic lesion and fluid buildup around the inner phalangeal joint with history of surgery years ago     Assessment:  Hammertoe deformity with inflammatory capsulitis digit 5 right with callus corn formation     Plan:  H&P and reviewed digital deformity and x-ray from previous.  I do think arthroplasty would be necessary and can be done in the office and I educated her on this and she wants to do it but needs to hold off short-term today I did do a proximal nerve block I then was able to carefully put a small amount of fluid into the inner phalangeal joint 2 mg dexamethasone Kenalog and I then debrided lesion applied padding and again reviewed surgical intervention

## 2018-08-28 ENCOUNTER — Other Ambulatory Visit: Payer: Self-pay | Admitting: Internal Medicine

## 2018-10-18 ENCOUNTER — Ambulatory Visit: Payer: Medicare Other | Admitting: Podiatry

## 2018-10-21 ENCOUNTER — Encounter: Payer: Self-pay | Admitting: Internal Medicine

## 2018-10-23 ENCOUNTER — Encounter: Payer: Self-pay | Admitting: Podiatry

## 2018-10-23 ENCOUNTER — Ambulatory Visit (INDEPENDENT_AMBULATORY_CARE_PROVIDER_SITE_OTHER): Payer: Managed Care, Other (non HMO) | Admitting: Podiatry

## 2018-10-23 ENCOUNTER — Other Ambulatory Visit: Payer: Self-pay

## 2018-10-23 VITALS — Temp 97.3°F

## 2018-10-23 DIAGNOSIS — M2041 Other hammer toe(s) (acquired), right foot: Secondary | ICD-10-CM | POA: Diagnosis not present

## 2018-10-23 NOTE — Patient Instructions (Signed)
Pre-Operative Instructions  Congratulations, you have decided to take an important step towards improving your quality of life.  You can be assured that the doctors and staff at [facility] will be with you every step of the way.  Here are some important things you should know:  1. Plan to be at the surgery center/hospital at least 1 (one) hour prior to your scheduled time, unless otherwise directed by the surgical center/hospital staff.  You must have a responsible adult accompany you, remain during the surgery and drive you home.  Make sure you have directions to the surgical center/hospital to ensure you arrive on time. 2. If you are having surgery at [facility] or [facility] hospitals, you will need a copy of your medical history and physical form from your family physician within one month prior to the date of surgery. We will give you a form for your primary physician to complete.  3. We make every effort to accommodate the date you request for surgery.  However, there are times where surgery dates or times have to be moved.  We will contact you as soon as possible if a change in schedule is required.   4. No aspirin/ibuprofen for one week before surgery.  If you are on aspirin, any non-steroidal anti-inflammatory medications (Mobic, Aleve, Ibuprofen) should not be taken seven (7) days prior to your surgery.  You make take Tylenol for pain prior to surgery.  5. Medications - If you are taking daily heart and blood pressure medications, seizure, reflux, allergy, asthma, anxiety, pain or diabetes medications, make sure you notify the surgery center/hospital before the day of surgery so they can tell you which medications you should take or avoid the day of surgery. 6. No food or drink after midnight the night before surgery unless directed otherwise by surgical center/hospital staff. 7. No alcoholic beverages 24-hours prior to surgery.  No smoking 24-hours prior or 24-hours after  surgery. 8. Wear loose pants or shorts. They should be loose enough to fit over bandages, boots, and casts. 9. Don't wear slip-on shoes. Sneakers are preferred. 10. Bring your boot with you to the surgery center/hospital.  Also bring crutches or a walker if your physician has prescribed it for you.  If you do not have this equipment, it will be provided for you after surgery. 11. If you have not been contacted by the surgery center/hospital by the day before your surgery, call to confirm the date and time of your surgery. 12. Leave-time from work may vary depending on the type of surgery you have.  Appropriate arrangements should be made prior to surgery with your employer. 13. Prescriptions will be provided immediately following surgery by your doctor.  Fill these as soon as possible after surgery and take the medication as directed. Pain medications will not be refilled on weekends and must be approved by the doctor. 14. Remove nail polish on the operative foot and avoid getting pedicures prior to surgery. 15. Wash the night before surgery.  The night before surgery wash the foot and leg well with water and the antibacterial soap provided. Be sure to pay special attention to beneath the toenails and in between the toes.  Wash for at least three (3) minutes. Rinse thoroughly with water and dry well with a towel.  Perform this wash unless told not to do so by your physician.  Enclosed: 1 Ice pack (please put in freezer the night before surgery)   1 Hibiclens skin cleaner     Pre-op instructions  If you have any questions regarding the instructions, please do not hesitate to call our office.  [place]: [address], [place], [place] [address] -- [phone]  [place]: [address] [place], [place] [address] -- [phone]  [place]: [address]  [place], [place] [address] -- [phone]  [place]: [address], [address], [place], [place] [address] -- [phone]  Website:  [email or web address] 

## 2018-10-24 NOTE — Progress Notes (Signed)
Subjective:   Patient ID: Veronica Carrillo, female   DOB: 73 y.o.   MRN: [id number]   HPI Patient presents stating she is ready to get this foot fixed as it is just been bothering her and she is tried wider shoes trimming and other modalities without relief   ROS      Objective:  Physical Exam  Neurovascular status intact with patient found to have significant keratotic lesion digit 5 right that is painful when pressed and make shoe gear difficult.  Patient is found to have good digital perfusion well oriented x3     Assessment:  Chronic hammertoe deformity fifth digit right with pain     Plan:  H&P discussed condition and recommended arthroplasty.  Explained procedure risk and patient read and then signed consent form after extensive review and is scheduled for arthroplasty procedure.  Patient understands risk and at this time signed consent form is given all preoperative instructions and understands total recovery can take 4 to 6 months.  Scheduled for surgery encouraged to call with questions

## 2018-10-27 NOTE — Progress Notes (Signed)
Subjective:    Patient ID: Veronica Carrillo, female    DOB: [birth year], 73 y.o.   MRN: [id number]  HPI The patient is here for follow up.  She is exercising - walking some.     Diabetes: She is taking her medication daily as prescribed. She is compliant with a diabetic diet.    She denies numbness/tingling in her feet and foot lesions.    Hyperlipidemia: She is taking her medication daily. She is compliant with a low fat/cholesterol diet. She denies myalgias.   Depression: She is taking her medication daily as prescribed. She denies any side effects from the medication. She feels her depression is well controlled and she is happy with her current dose of medication.   Anxiety: She is taking her medication daily as prescribed. She denies any side effects from the medication. She feels her anxiety is controlled and she is happy with her current dose of medication.   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  She states her diarrhea is controlled.  She did try to go without medication and she had diarrhea again.  Medications and allergies reviewed with patient and updated if appropriate.  Patient Active Problem List   Diagnosis Date Noted  . Elevated blood pressure reading 01/30/2018  . Urinary incontinence 01/30/2018  . Irritable bowel syndrome (IBS) 03/27/2017  . Acute pain of right knee 11/28/2016  . Hyperlipidemia 10/01/2015  . Diverticulitis of colon 10/01/2015  . Diabetes ([place]) 10/01/2015  . Depression 10/01/2015  . Anxiety 10/01/2015  . Arthritis of right knee 10/01/2015    No current outpatient medications on file prior to visit.   No current facility-administered medications on file prior to visit.     Past Medical History:  Diagnosis Date  . Anxiety   . Arthritis   . Depression   . Diabetes mellitus without complication ([place])   . Diverticulitis   . History of colon polyps   . Hyperlipidemia   . IBS (irritable bowel syndrome)     Past Surgical  History:  Procedure Laterality Date  . ABDOMINAL EXPLORATION SURGERY    . ABDOMINAL HYSTERECTOMY    . BREAST BIOPSY Left   . COLONOSCOPY    . DILATION AND CURETTAGE OF UTERUS     x 3  . POLYPECTOMY    . TONSILLECTOMY      Social History   Socioeconomic History  . Marital status: Single    Spouse name: Not on file  . Number of children: 1  . Years of education: Not on file  . Highest education level: Not on file  Occupational History  . Occupation: retired  . Occupation: PT recepionist  Social Needs  . Financial resource strain: Not on file  . Food insecurity    Worry: Not on file    Inability: Not on file  . Transportation needs    Medical: Not on file    Non-medical: Not on file  Tobacco Use  . Smoking status: Never Smoker  . Smokeless tobacco: Never Used  Substance and Sexual Activity  . Alcohol use: Yes    Alcohol/week: 0.0 standard drinks    Comment: wine cooler-ocassional  . Drug use: No  . Sexual activity: Not on file  Lifestyle  . Physical activity    Days per week: Not on file    Minutes per session: Not on file  . Stress: Not on file  Relationships  . Social Herbalist on phone: Not  on file    Gets together: Not on file    Attends religious service: Not on file    Active member of club or organization: Not on file    Attends meetings of clubs or organizations: Not on file    Relationship status: Not on file  Other Topics Concern  . Not on file  Social History Narrative   Exercise: [employer], walking      Moved from [place]    Family History  Problem Relation Age of Onset  . Hypertension Mother   . Bladder Cancer Father   . Heart disease Father   . Diabetes Paternal Grandmother   . Colon cancer Neg Hx   . Esophageal cancer Neg Hx   . Liver cancer Neg Hx   . Pancreatic cancer Neg Hx   . Rectal cancer Neg Hx   . Stomach cancer Neg Hx     Review of Systems  Constitutional: Negative for chills and fever.  Respiratory: Negative for  cough, shortness of breath and wheezing.   Cardiovascular: Negative for chest pain, palpitations and leg swelling.  Neurological: Negative for light-headedness and headaches.       Objective:   Vitals:   10/29/18 0947  BP: 132/84  Pulse: 94  Resp: 16  Temp: 98.3 F (36.8 C)  SpO2: 96%   BP Readings from Last 3 Encounters:  10/29/18 132/84  05/30/18 130/80  01/30/18 (!) 146/88   Wt Readings from Last 3 Encounters:  10/29/18 210 lb (95.3 kg)  05/30/18 211 lb (95.7 kg)  01/30/18 217 lb 12.8 oz (98.8 kg)   Body mass index is 37.2 kg/m.   Physical Exam    Constitutional: Appears well-developed and well-nourished. No distress.  HENT:  Head: Normocephalic and atraumatic.  Neck: Neck supple. No tracheal deviation present. No thyromegaly present.  No cervical lymphadenopathy Cardiovascular: Normal rate, regular rhythm and normal heart sounds.   No murmur heard. No carotid bruit .  No edema Pulmonary/Chest: Effort normal and breath sounds normal. No respiratory distress. No has no wheezes. No rales.  Skin: Skin is warm and dry. Not diaphoretic.  Psychiatric: Normal mood and affect. Behavior is normal.    Diabetic Foot Exam - Simple   Simple Foot Form Diabetic Foot exam was performed with the following findings: Yes 10/29/2018 10:36 AM  Visual Inspection No deformities, no ulcerations, no other skin breakdown bilaterally: Yes Sensation Testing Intact to touch and monofilament testing bilaterally: Yes Pulse Check Posterior Tibialis and Dorsalis pulse intact bilaterally: Yes Comments      Assessment & Plan:    See Problem List for Assessment and Plan of chronic medical problems.

## 2018-10-27 NOTE — Patient Instructions (Addendum)
  Tests ordered today. Your results will be released to [facility] (or called to you) after review.  If any changes need to be made, you will be notified at that same time.   Pneumonia immunization administered today.   Medications reviewed and updated.  Changes include :  none   Your prescription(s) have been submitted to your pharmacy. Please take as directed and contact our office if you believe you are having problem(s) with the medication(s).   Please followup in 6 months

## 2018-10-29 ENCOUNTER — Other Ambulatory Visit: Payer: Self-pay

## 2018-10-29 ENCOUNTER — Encounter: Payer: Self-pay | Admitting: Internal Medicine

## 2018-10-29 ENCOUNTER — Other Ambulatory Visit (INDEPENDENT_AMBULATORY_CARE_PROVIDER_SITE_OTHER): Payer: Managed Care, Other (non HMO)

## 2018-10-29 ENCOUNTER — Ambulatory Visit (INDEPENDENT_AMBULATORY_CARE_PROVIDER_SITE_OTHER): Payer: Managed Care, Other (non HMO) | Admitting: Internal Medicine

## 2018-10-29 VITALS — BP 132/84 | HR 94 | Temp 98.3°F | Resp 16 | Ht 63.0 in | Wt 210.0 lb

## 2018-10-29 DIAGNOSIS — Z23 Encounter for immunization: Secondary | ICD-10-CM

## 2018-10-29 DIAGNOSIS — F3289 Other specified depressive episodes: Secondary | ICD-10-CM | POA: Diagnosis not present

## 2018-10-29 DIAGNOSIS — K580 Irritable bowel syndrome with diarrhea: Secondary | ICD-10-CM

## 2018-10-29 DIAGNOSIS — E119 Type 2 diabetes mellitus without complications: Secondary | ICD-10-CM

## 2018-10-29 DIAGNOSIS — F419 Anxiety disorder, unspecified: Secondary | ICD-10-CM | POA: Diagnosis not present

## 2018-10-29 DIAGNOSIS — E7849 Other hyperlipidemia: Secondary | ICD-10-CM | POA: Diagnosis not present

## 2018-10-29 LAB — LIPID PANEL
Cholesterol: 108 mg/dL (ref 0–200)
HDL: 42.9 mg/dL (ref >39.00)
LDL Cholesterol: 43 mg/dL (ref 0–99)
NonHDL: 64.93
Total CHOL/HDL Ratio: 3
Triglycerides: 109 mg/dL (ref 0.0–149.0)
VLDL: 21.8 mg/dL (ref 0.0–40.0)

## 2018-10-29 LAB — COMPREHENSIVE METABOLIC PANEL
ALT: 40 U/L — ABNORMAL HIGH (ref 0–35)
AST: 32 U/L (ref 0–37)
Albumin: 4.3 g/dL (ref 3.5–5.2)
Alkaline Phosphatase: 116 U/L (ref 39–117)
BUN: 10 mg/dL (ref 6–23)
CO2: 27 mEq/L (ref 19–32)
Calcium: 9.4 mg/dL (ref 8.4–10.5)
Chloride: 106 mEq/L (ref 96–112)
Creatinine, Ser: 1.96 mg/dL — ABNORMAL HIGH (ref 0.40–1.20)
GFR: 30.32 mL/min — ABNORMAL LOW (ref >60.00)
Glucose, Bld: 141 mg/dL — ABNORMAL HIGH (ref 70–99)
Potassium: 4.5 mEq/L (ref 3.5–5.1)
Sodium: 140 mEq/L (ref 135–145)
Total Bilirubin: 1 mg/dL (ref 0.2–1.2)
Total Protein: 7.4 g/dL (ref 6.0–8.3)

## 2018-10-29 LAB — HEMOGLOBIN A1C: Hgb A1c MFr Bld: 7 % — ABNORMAL HIGH (ref 4.6–6.5)

## 2018-10-29 LAB — MICROALBUMIN / CREATININE URINE RATIO
Creatinine,U: 192.2 mg/dL
Microalb Creat Ratio: 9.5 mg/g (ref 0.0–30.0)
Microalb, Ur: 18.3 mg/dL — ABNORMAL HIGH (ref 0.0–1.9)

## 2018-10-29 MED ORDER — LOPERAMIDE HCL 2 MG PO CAPS: 2.0000 mg | ORAL_CAPSULE | Freq: Every day | ORAL | 1 refills | Status: DC

## 2018-10-29 MED ORDER — VENLAFAXINE HCL ER 75 MG PO CP24: 75.0000 mg | ORAL_CAPSULE | Freq: Every day | ORAL | 1 refills | Status: DC

## 2018-10-29 MED ORDER — ROSUVASTATIN CALCIUM 40 MG PO TABS: 40.0000 mg | ORAL_TABLET | Freq: Every day | ORAL | 1 refills | Status: DC

## 2018-10-29 MED ORDER — METFORMIN HCL 500 MG PO TABS: 500.0000 mg | ORAL_TABLET | Freq: Every day | ORAL | 1 refills | Status: DC

## 2018-10-29 NOTE — Assessment & Plan Note (Addendum)
Compliant with diabetic diet, doing some walking Check A1c, urine microalbumin Continue metformin once daily Follow-up in 6 months

## 2018-10-29 NOTE — Assessment & Plan Note (Signed)
Has diarrhea Diarrhea is controlled with Imodium daily Continue

## 2018-10-29 NOTE — Assessment & Plan Note (Signed)
Check lipid panel, CMP Continue daily statin Regular exercise and healthy diet encouraged

## 2018-10-29 NOTE — Assessment & Plan Note (Signed)
Controlled, stable Continue current dose of medication-Effexor 75 mg daily

## 2018-10-31 ENCOUNTER — Other Ambulatory Visit: Payer: Self-pay | Admitting: Emergency Medicine

## 2018-10-31 DIAGNOSIS — N289 Disorder of kidney and ureter, unspecified: Secondary | ICD-10-CM

## 2018-10-31 NOTE — Progress Notes (Signed)
Labs entered.

## 2018-11-01 ENCOUNTER — Telehealth: Payer: Self-pay | Admitting: *Deleted

## 2018-11-01 NOTE — Telephone Encounter (Signed)
"  I need to push my surgery that's scheduled for August 4 out a month.  Can you help me with that?  I haven't received a call from anyone either.  I'd like to do it after Labor Day."  Dr. Paulla Dolly can do it on December 17, 2018.  "That day will be fine."  You will not receive a call from the surgical center until the Friday or Monday prior to your surgery date.  "Oh, that's why I hadn't heard anything.  I appreciate your help."  I rescheduled the surgery from 11/12/2018 to 12/17/2018 via the surgical center's [facility].

## 2018-12-02 ENCOUNTER — Encounter: Payer: Self-pay | Admitting: Internal Medicine

## 2018-12-02 ENCOUNTER — Ambulatory Visit (INDEPENDENT_AMBULATORY_CARE_PROVIDER_SITE_OTHER): Payer: Managed Care, Other (non HMO) | Admitting: Internal Medicine

## 2018-12-02 ENCOUNTER — Other Ambulatory Visit: Payer: Self-pay

## 2018-12-02 VITALS — BP 144/80 | HR 87 | Temp 99.1°F | Resp 16 | Ht 63.0 in | Wt 210.0 lb

## 2018-12-02 DIAGNOSIS — Z23 Encounter for immunization: Secondary | ICD-10-CM

## 2018-12-02 DIAGNOSIS — K580 Irritable bowel syndrome with diarrhea: Secondary | ICD-10-CM

## 2018-12-02 NOTE — Assessment & Plan Note (Signed)
IBS-Diarrhea  No symptoms or signs c/w diverticulitis and she has had longstanding IBS with diarrhea 1 Imodium daily has been effective Recently Imodium 1 pill daily has not been effective-okay to try to, but also recommended touching base with GI-she is not talked to them recently about her IBS-D.  She has tried several things in the past, but there are newer treatments out that she may want to consider

## 2018-12-02 NOTE — Progress Notes (Signed)
Subjective:    Patient ID: Veronica Carrillo, female    DOB: [birth year], 73 y.o.   MRN: [id number]  HPI The patient is here for an acute visit.  She takes imodium daily for her IBS, which has worked until recently.  One week ago she was about to go to work and had 3 episodes of diarrhea like she had not taken it.  The one imodium pill does not seem to be working.   She wondered about taking 2 pills.  She denies any changes in medication or diet.  She has FMLA for work and has not been able to return for a few days, but wants to go back.    She denies abdominal pain or cramping.  She denies fever/chills. She denies blood in the stool.  She denies nausea.    Medications and allergies reviewed with patient and updated if appropriate.  Patient Active Problem List   Diagnosis Date Noted  . Elevated blood pressure reading 01/30/2018  . Urinary incontinence 01/30/2018  . Irritable bowel syndrome (IBS) 03/27/2017  . Acute pain of right knee 11/28/2016  . Hyperlipidemia 10/01/2015  . Diverticulitis of colon 10/01/2015  . Diabetes ([place]) 10/01/2015  . Depression 10/01/2015  . Anxiety 10/01/2015  . Arthritis of right knee 10/01/2015    Current Outpatient Medications on File Prior to Visit  Medication Sig Dispense Refill  . loperamide (IMODIUM) 2 MG capsule Take 1 capsule (2 mg total) by mouth daily. 90 capsule 1  . metFORMIN (GLUCOPHAGE) 500 MG tablet Take 1 tablet (500 mg total) by mouth daily with breakfast. 90 tablet 1  . rosuvastatin (CRESTOR) 40 MG tablet Take 1 tablet (40 mg total) by mouth daily. 90 tablet 1  . venlafaxine XR (EFFEXOR-XR) 75 MG 24 hr capsule Take 1 capsule (75 mg total) by mouth daily. 90 capsule 1   No current facility-administered medications on file prior to visit.     Past Medical History:  Diagnosis Date  . Anxiety   . Arthritis   . Depression   . Diabetes mellitus without complication ([place])   . Diverticulitis   . History of colon polyps   .  Hyperlipidemia   . IBS (irritable bowel syndrome)     Past Surgical History:  Procedure Laterality Date  . ABDOMINAL EXPLORATION SURGERY    . ABDOMINAL HYSTERECTOMY    . BREAST BIOPSY Left   . COLONOSCOPY    . DILATION AND CURETTAGE OF UTERUS     x 3  . POLYPECTOMY    . TONSILLECTOMY      Social History   Socioeconomic History  . Marital status: Single    Spouse name: Not on file  . Number of children: 1  . Years of education: Not on file  . Highest education level: Not on file  Occupational History  . Occupation: retired  . Occupation: PT recepionist  Social Needs  . Financial resource strain: Not on file  . Food insecurity    Worry: Not on file    Inability: Not on file  . Transportation needs    Medical: Not on file    Non-medical: Not on file  Tobacco Use  . Smoking status: Never Smoker  . Smokeless tobacco: Never Used  Substance and Sexual Activity  . Alcohol use: Yes    Alcohol/week: 0.0 standard drinks    Comment: wine cooler-ocassional  . Drug use: No  . Sexual activity: Not on file  Lifestyle  . Physical activity  Days per week: Not on file    Minutes per session: Not on file  . Stress: Not on file  Relationships  . Social Herbalist on phone: Not on file    Gets together: Not on file    Attends religious service: Not on file    Active member of club or organization: Not on file    Attends meetings of clubs or organizations: Not on file    Relationship status: Not on file  Other Topics Concern  . Not on file  Social History Narrative   Exercise: [employer], walking      Moved from [place]    Family History  Problem Relation Age of Onset  . Hypertension Mother   . Bladder Cancer Father   . Heart disease Father   . Diabetes Paternal Grandmother   . Colon cancer Neg Hx   . Esophageal cancer Neg Hx   . Liver cancer Neg Hx   . Pancreatic cancer Neg Hx   . Rectal cancer Neg Hx   . Stomach cancer Neg Hx     Review of Systems   Constitutional: Negative for chills and fever.  Gastrointestinal: Positive for diarrhea. Negative for abdominal pain, blood in stool and nausea.       Objective:   Vitals:   12/02/18 1348  BP: (!) 144/80  Pulse: 87  Resp: 16  Temp: 99.1 F (37.3 C)  SpO2: 98%   BP Readings from Last 3 Encounters:  12/02/18 (!) 144/80  10/29/18 132/84  05/30/18 130/80   Wt Readings from Last 3 Encounters:  12/02/18 210 lb (95.3 kg)  10/29/18 210 lb (95.3 kg)  05/30/18 211 lb (95.7 kg)   Body mass index is 37.2 kg/m.   Physical Exam Constitutional:      General: She is not in acute distress.    Appearance: Normal appearance. She is not ill-appearing.  HENT:     Head: Normocephalic and atraumatic.  Abdominal:     General: There is no distension.     Palpations: Abdomen is soft. There is no mass.     Tenderness: There is no abdominal tenderness. There is no guarding or rebound.  Skin:    General: Skin is warm and dry.  Neurological:     Mental Status: She is alert.            Assessment & Plan:    See Problem List for Assessment and Plan of chronic medical problems.

## 2018-12-02 NOTE — Patient Instructions (Addendum)
   Flu immunization administered today.     Medications reviewed and updated.  Changes include :   Try two imodium daily.

## 2018-12-06 ENCOUNTER — Telehealth: Payer: Self-pay | Admitting: *Deleted

## 2018-12-06 NOTE — Telephone Encounter (Signed)
DOS 12/17/2018 HAMMER TOE REPAIR 5TH RT - [id number]  [employer]: Effective Date - Apr 10, 2017   Co-Insurance - Health Benefit Plan Coverage  In Network Individual  Benefit Date Jul 10, 2018 This benefit does apply to member's out-of-pocket maximum  20 %    Deductible - Health Benefit Plan Coverage  In Network Individual  Benefit does apply to member's out-of-pocket maximum  Accumulators are shared between [place]  $1,300.00  Calendar Year  - $367.06  Year to Date  $932.94  Remaining   In Network Family  Benefit does apply to member's out-of-pocket maximum  Accumulators are shared between [place]  $3,900.00  Calendar Year  - $367.06  Year to Date  $3,532.94  Remaining    Out of Pocket (Stop Loss) - Health Benefit Plan Coverage  In Network Individual  Accumulators are shared between [place]  $3,200.00  Calendar Year  - $453.19  Year to Date  $2,746.81  Remaining   In Network Family  Accumulators are shared between [place]  $9,600.00  Calendar Year  - $453.19  Year to Date  $9,146.81  Remaining

## 2018-12-20 ENCOUNTER — Telehealth: Payer: Self-pay | Admitting: *Deleted

## 2018-12-20 NOTE — Telephone Encounter (Signed)
"  I didn't realize I was supposed to have surgery on September 8.  Would you please call me back at your earliest convenience."

## 2018-12-23 ENCOUNTER — Encounter: Payer: Medicare Other | Admitting: Podiatry

## 2018-12-26 ENCOUNTER — Telehealth: Payer: Self-pay | Admitting: *Deleted

## 2018-12-26 NOTE — Telephone Encounter (Signed)
DOS 09/292020  Rescheduled from 12/17/2018 HAMMER TOE REPAIR 5TH RT - [id number]  [employer]: Effective Date - Apr 10, 2017   Co-Insurance - Health Benefit Plan Coverage  In Network Individual   Benefit Date Jul 10, 2018  This benefit does apply to member's out-of-pocket maximum  20 %    Deductible - Health Benefit Plan Coverage  In Network Individual   Benefit does apply to member's out-of-pocket maximum   Accumulators are shared between [place]  $1,300.00  Calendar Year  - $367.06  Year to Date  $932.94  Remaining    In Network Family   Benefit does apply to member's out-of-pocket maximum   Accumulators are shared between [place]  $3,900.00  Calendar Year  - $367.06  Year to Date  $3,532.94  Remaining    Out of Pocket (Stop Loss) - Health Benefit Plan Coverage  In Network Individual   Accumulators are shared between [place]  $3,200.00  Calendar Year  - $453.19  Year to Date  $2,746.81  Remaining    In Network Family   Accumulators are shared between [place]  $9,600.00  Calendar Year  - $453.19  Year to Date  $9,146.81  Remaining

## 2018-12-26 NOTE — Telephone Encounter (Signed)
"  I'm sorry I missed your call.  I thought someone was going to call and let me know when not to eat or drink anything."  You will not receive a call in regards to that, however, that information can be found on the pre-operative sheet that we gave you.  You will get a call from the surgical center a day or two prior to your surgery date and they will give you your arrival time.  Dr. Paulla Dolly does surgeries on Tuesdays.  Do you have a date that you would like?  "I'd like to do it at the end of this month."  Dr. Paulla Dolly can do it on January 07, 2019.  "That date will be fine."  I'll get your surgery rescheduled to then.  I rescheduled the surgery via the surgical center's [facility] Portal.

## 2018-12-26 NOTE — Telephone Encounter (Signed)
I attempted to return her call.  I left her a message to call me back. 

## 2018-12-30 ENCOUNTER — Telehealth: Payer: Self-pay | Admitting: *Deleted

## 2018-12-30 NOTE — Telephone Encounter (Signed)
"  My job wanted me to see if I can move my surgery to a week later on October 6."  Yes, that will be fine.  I'll get it rescheduled from 01/07/2019 to 01/14/2019.  "Another question, about how long will I be out of work?"  If you sit, you can go back after a week as long as you can elevate your foot.  If you walk much, you could be out for about 4 weeks.  "Alright, I'll let my job know."    I rescheduled her surgery from 01/07/2019 to 01/14/2019 via the surgical center's [facility] Portal.

## 2019-01-06 NOTE — Telephone Encounter (Signed)
Patient rescheduled her surgery date from 01/07/2019 to 01/14/2019.

## 2019-01-13 ENCOUNTER — Other Ambulatory Visit: Payer: Medicare Other

## 2019-01-13 ENCOUNTER — Telehealth: Payer: Self-pay | Admitting: *Deleted

## 2019-01-13 NOTE — Telephone Encounter (Signed)
Call to reschedule

## 2019-01-13 NOTE — Telephone Encounter (Signed)
"  I am calling to let you know Veronica Carrillo has canceled her surgery for tomorrow."  Did she say why?  "She has Strept throat.  She could hardly talk."  I'll let Dr. Paulla Dolly know.

## 2019-01-20 ENCOUNTER — Ambulatory Visit (INDEPENDENT_AMBULATORY_CARE_PROVIDER_SITE_OTHER): Payer: Medicare Other | Admitting: Podiatry

## 2019-01-20 DIAGNOSIS — Z09 Encounter for follow-up examination after completed treatment for conditions other than malignant neoplasm: Secondary | ICD-10-CM

## 2019-01-20 DIAGNOSIS — M2041 Other hammer toe(s) (acquired), right foot: Secondary | ICD-10-CM

## 2019-01-23 NOTE — Telephone Encounter (Signed)
Pt is rescheduled for surgery on 02/18/2019.

## 2019-01-23 NOTE — Telephone Encounter (Signed)
Called pt to get her surgery rescheduled. I told her we could do Tuesday 02/18/2019. Pt stated that was fine. I told her I would contact the surgical center and let them know. I also told her that someone from there would call a day or two prior to let her know what time to arrive. Pt stated she has some disability forms she will drop off also. I told her Marcie Bal takes care of those and that Marcie Bal is only here on Wednesday's and Thursday's but she could drop the forms off anytime.

## 2019-01-29 ENCOUNTER — Telehealth: Payer: Self-pay | Admitting: Podiatry

## 2019-01-29 NOTE — Telephone Encounter (Signed)
DOS: 02/18/2019 °SURGICAL PROCEDURE: Hammertoe Repair 5th Rt °CPT CODE: 28285 °DX CODE: M20.41 ° °In Network Benefits: ° °    Individual Deductible is $1,300 with $367.06 met and $932.94 to be meet. °    Family Deductible is $3,900 with $367.06 met and $3,532.94 to meet. °    Individual Out of Pocket is $3,200 with $481.30 met and $2,718.70 to meet. °    Family Out of Pocket is $9,600 with $481.30 met and $9,118.70 to meet. ° °CoInsurance is 80%/20% once individual or family deductible is met. °CoInsurance is 100% once individual or family out of pocket is met. ° °No Prior Authorization or Referrals are required Per Yelsie Mendoza. Ref# [id number]. °

## 2019-02-03 ENCOUNTER — Encounter: Payer: Medicare Other | Admitting: Podiatry

## 2019-02-12 ENCOUNTER — Other Ambulatory Visit: Payer: Self-pay | Admitting: Internal Medicine

## 2019-02-12 DIAGNOSIS — Z1231 Encounter for screening mammogram for malignant neoplasm of breast: Secondary | ICD-10-CM

## 2019-02-17 ENCOUNTER — Telehealth: Payer: Self-pay | Admitting: Podiatry

## 2019-02-17 NOTE — Telephone Encounter (Signed)
Called pt and let her know that someone from the surgical center always calls a day or two prior to let the pt know their surgery time. I explained we cannot give the time as it depends if the surgical center has any changes to their schedule, or any pt's that are pediatric or diabetic that need to go back first. I offered to give her the phone number to the surgical center but pt stated she would wait for them to call her.

## 2019-02-17 NOTE — Telephone Encounter (Signed)
I'm scheduled to have surgery tomorrow but no one has called and told me what time to be there. Can you call me back and let me know that information? Thank you.

## 2019-02-18 ENCOUNTER — Encounter: Payer: Self-pay | Admitting: Podiatry

## 2019-02-18 DIAGNOSIS — M2041 Other hammer toe(s) (acquired), right foot: Secondary | ICD-10-CM | POA: Diagnosis not present

## 2019-02-24 ENCOUNTER — Ambulatory Visit (INDEPENDENT_AMBULATORY_CARE_PROVIDER_SITE_OTHER): Payer: Managed Care, Other (non HMO) | Admitting: Podiatry

## 2019-02-24 ENCOUNTER — Encounter: Payer: Self-pay | Admitting: Podiatry

## 2019-02-24 ENCOUNTER — Ambulatory Visit (INDEPENDENT_AMBULATORY_CARE_PROVIDER_SITE_OTHER): Payer: Managed Care, Other (non HMO)

## 2019-02-24 ENCOUNTER — Other Ambulatory Visit: Payer: Self-pay

## 2019-02-24 DIAGNOSIS — M2041 Other hammer toe(s) (acquired), right foot: Secondary | ICD-10-CM | POA: Diagnosis not present

## 2019-02-26 NOTE — Progress Notes (Signed)
Subjective:   Patient ID: Veronica Carrillo, female   DOB: 73 y.o.   MRN: [id number]   HPI Patient states doing well and states very pleased so far with surgery   ROS      Objective:  Physical Exam  Neurovascular status intact negative Bevelyn Buckles' sign noted with wound edges well coapted right fifth digit stitches intact good alignment of the toe     Assessment:  Doing well arthroplasty fifth digit right foot     Plan:  H&P x-ray reviewed sterile dressing reapplied continue elevation compression and reappoint 2 weeks suture removal or earlier if needed  X-rays indicate satisfactory resection of bone had a proximal phalanx digit 5 right

## 2019-03-10 ENCOUNTER — Ambulatory Visit (INDEPENDENT_AMBULATORY_CARE_PROVIDER_SITE_OTHER): Payer: Managed Care, Other (non HMO)

## 2019-03-10 ENCOUNTER — Encounter: Payer: Self-pay | Admitting: Podiatry

## 2019-03-10 ENCOUNTER — Other Ambulatory Visit: Payer: Self-pay

## 2019-03-10 ENCOUNTER — Ambulatory Visit (INDEPENDENT_AMBULATORY_CARE_PROVIDER_SITE_OTHER): Payer: Medicare Other | Admitting: Podiatry

## 2019-03-10 DIAGNOSIS — M2041 Other hammer toe(s) (acquired), right foot: Secondary | ICD-10-CM | POA: Diagnosis not present

## 2019-03-10 NOTE — Progress Notes (Signed)
Subjective:   Patient ID: Veronica Carrillo, female   DOB: 73 y.o.   MRN: [id number]   HPI Patient presents stating doing pretty well with the toe but is not ready to return to work and wear shoe gear   ROS      Objective:  Physical Exam  Neurovascular status intact muscle strength adequate with patient noted to have well-healed surgical site fifth digit right stitches intact     Assessment:  Doing well post digital arthroplasty digit 5 right     Plan:  Stitches removed wound edges coapted well reviewed x-ray and gradual return to soft shoe gear and hopeful return to work 4 weeks  X-rays indicate that satisfactory section of bone is encouraged with good alignment of the digit

## 2019-03-30 ENCOUNTER — Other Ambulatory Visit: Payer: Self-pay | Admitting: Internal Medicine

## 2019-04-02 ENCOUNTER — Ambulatory Visit
Admission: RE | Admit: 2019-04-02 | Discharge: 2019-04-02 | Disposition: A | Discharge status: Home / Self Care | Payer: Medicare Other | Source: Ambulatory Visit | Attending: Internal Medicine | Admitting: Internal Medicine

## 2019-04-02 ENCOUNTER — Other Ambulatory Visit: Payer: Self-pay

## 2019-04-02 DIAGNOSIS — Z1231 Encounter for screening mammogram for malignant neoplasm of breast: Secondary | ICD-10-CM

## 2019-05-30 NOTE — Progress Notes (Signed)
Virtual Visit via telephone Note  I connected with Veronica Carrillo on 06/02/19 at  9:15 AM EST by telephone and verified that I am speaking with the correct person using two identifiers.   I discussed the limitations of evaluation and management by telemedicine and the availability of in person appointments. The patient expressed understanding and agreed to proceed.  Present for the visit:  Myself, Dr Billey Gosling, Valerie Roys.  The patient is currently at home and I am in the office.    No referring provider.    History of Present Illness: This is an acute visit for IBS.  She has a long history of IBS with diarrhea with fecal incontinence with urgency and explosive BMs.   She has episodes approximately once a month and there is no pattern to why it occurs or obvious cause.  She is still working and needs her FMLA renewed.  She uses the FMLA if she has an episode and needs to go home again.  She continues to take 1 Imodium daily and it still seems to be effective, but is not always preventing the episodes.  Overall she feels her IBS has been stable and she does not feel the need to see GI at this time.  She has tried several things in the past and is content with using the Imodium daily.  She is not having any significant abdominal pain or blood in stool.  There are no fevers.       Social History   Socioeconomic History  . Marital status: Single    Spouse name: Not on file  . Number of children: 1  . Years of education: Not on file  . Highest education level: Not on file  Occupational History  . Occupation: retired  . Occupation: PT recepionist  Tobacco Use  . Smoking status: Never Smoker  . Smokeless tobacco: Never Used  Substance and Sexual Activity  . Alcohol use: Yes    Alcohol/week: 0.0 standard drinks    Comment: wine cooler-ocassional  . Drug use: No  . Sexual activity: Not on file  Other Topics Concern  . Not on file  Social History Narrative   Exercise:  [employer], walking      Moved from [place] Strain:   . Difficulty of Paying Living Expenses: Not on file  Food Insecurity:   . Worried About Charity fundraiser in the Last Year: Not on file  . Ran Out of Food in the Last Year: Not on file  Transportation Needs:   . Lack of Transportation (Medical): Not on file  . Lack of Transportation (Non-Medical): Not on file  Physical Activity:   . Days of Exercise per Week: Not on file  . Minutes of Exercise per Session: Not on file  Stress:   . Feeling of Stress : Not on file  Social Connections:   . Frequency of Communication with Friends and Family: Not on file  . Frequency of Social Gatherings with Friends and Family: Not on file  . Attends Religious Services: Not on file  . Active Member of Clubs or Organizations: Not on file  . Attends Archivist Meetings: Not on file  . Marital Status: Not on file      Assessment and Plan:  See Problem List for Assessment and Plan of chronic medical problems.   Follow Up Instructions:    I discussed the assessment and treatment plan with the  patient. The patient was provided an opportunity to ask questions and all were answered. The patient agreed with the plan and demonstrated an understanding of the instructions.   The patient was advised to call back or seek an in-person evaluation if the symptoms worsen or if the condition fails to improve as anticipated.  Time spent on telephone: 8 minutes  Binnie Rail, MD

## 2019-06-02 ENCOUNTER — Ambulatory Visit (INDEPENDENT_AMBULATORY_CARE_PROVIDER_SITE_OTHER): Payer: Managed Care, Other (non HMO) | Admitting: Internal Medicine

## 2019-06-02 ENCOUNTER — Encounter: Payer: Self-pay | Admitting: Internal Medicine

## 2019-06-02 DIAGNOSIS — K580 Irritable bowel syndrome with diarrhea: Secondary | ICD-10-CM

## 2019-06-02 NOTE — Assessment & Plan Note (Signed)
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

## 2019-06-05 ENCOUNTER — Telehealth: Payer: Self-pay | Admitting: Internal Medicine

## 2019-06-05 NOTE — Telephone Encounter (Signed)
I received renewal FMLA forms for patient via fax.   Forms have been completed & Placed in providers box to review and sign.

## 2019-06-06 ENCOUNTER — Ambulatory Visit: Payer: Medicare Other | Attending: Internal Medicine

## 2019-06-06 DIAGNOSIS — Z0279 Encounter for issue of other medical certificate: Secondary | ICD-10-CM

## 2019-06-06 DIAGNOSIS — Z23 Encounter for immunization: Secondary | ICD-10-CM

## 2019-06-06 NOTE — Progress Notes (Signed)
   Covid-19 Vaccination Clinic  Name:  Veronica Carrillo    MRN: [id number] DOB: 1945-07-16  06/06/2019  [name] was observed post Covid-19 immunization for 15 minutes without incidence. She was provided with Vaccine Information Sheet and instruction to access the V-Safe system.   [name] was instructed to call 911 with any severe reactions post vaccine: [place] Kitchen Difficulty breathing  . Swelling of your face and throat  . A fast heartbeat  . A bad rash all over your body  . Dizziness and weakness    Immunizations Administered    Name Date Dose VIS Date Route   Pfizer COVID-19 Vaccine 06/06/2019  9:08 AM 0.3 mL 03/21/2019 Intramuscular   Manufacturer: [place]   Lot: [id number]   [place]: [id number]

## 2019-06-06 NOTE — Telephone Encounter (Signed)
Forms have been signed, Faxed, Copy sent to scan &Charged for.   LVM to inform patient, Original has been mailed.

## 2019-07-01 ENCOUNTER — Ambulatory Visit: Payer: Medicare Other | Attending: Internal Medicine

## 2019-07-01 DIAGNOSIS — Z23 Encounter for immunization: Secondary | ICD-10-CM

## 2019-07-01 NOTE — Progress Notes (Signed)
   Covid-19 Vaccination Clinic  Name:  Ainslie Mazurek    MRN: [id number] DOB: [birth year]  07/01/2019  Ms. [place] was observed post Covid-19 immunization for 15 minutes without incident. She was provided with Vaccine Information Sheet and instruction to access the V-Safe system.   Ms. Santelli was instructed to call 911 with any severe reactions post vaccine: Marland Kitchen Difficulty breathing  . Swelling of face and throat  . A fast heartbeat  . A bad rash all over body  . Dizziness and weakness   Immunizations Administered    Name Date Dose VIS Date Route   Pfizer COVID-19 Vaccine 07/01/2019 10:56 AM 0.3 mL 03/21/2019 Intramuscular   Manufacturer: [place]   Lot: [id number]   [place]: [address]-3

## 2019-07-22 ENCOUNTER — Telehealth: Payer: Self-pay | Admitting: Internal Medicine

## 2019-07-22 MED ORDER — VENLAFAXINE HCL ER 75 MG PO CP24: 75.0000 mg | ORAL_CAPSULE | Freq: Every day | ORAL | 0 refills | Status: DC

## 2019-07-22 MED ORDER — ROSUVASTATIN CALCIUM 40 MG PO TABS: 40.0000 mg | ORAL_TABLET | Freq: Every day | ORAL | 0 refills | Status: DC

## 2019-07-22 MED ORDER — METFORMIN HCL 500 MG PO TABS: 500.0000 mg | ORAL_TABLET | Freq: Every day | ORAL | 0 refills | Status: DC

## 2019-07-22 NOTE — Telephone Encounter (Signed)
Reviewed chart pt is up-to-date sent refills to [facility].../lmb  

## 2019-07-22 NOTE — Telephone Encounter (Signed)
New message:   1.Medication Requested: metFORMIN (GLUCOPHAGE) 500 MG tablet rosuvastatin (CRESTOR) 40 MG tablet venlafaxine XR (EFFEXOR-XR) 75 MG 24 hr capsule 2. Pharmacy (Name, Street, [place]): [facility], [place] 3. On Med List: yes  4. Last Visit with PCP:   5. Next visit date with PCP:  Pt states she just needs a weeks worth of this medication until her order from [facility] shows up. Agent: Please be advised that RX refills may take up to 3 business days. We ask that you follow-up with your pharmacy.

## 2019-10-20 ENCOUNTER — Other Ambulatory Visit: Payer: Self-pay | Admitting: Internal Medicine

## 2019-10-20 NOTE — Telephone Encounter (Signed)
    1.Medication Requested:metFORMIN (GLUCOPHAGE) 500 MG tablet  2. Pharmacy (Name, Street, [place]): [place], [place] [employer] [facility], [address]  3. On Med List: YES  4. Last Visit with PCP: 06/02/19  5. Next visit date with PCP: N/A   Agent: Please be advised that RX refills may take up to 3 business days. We ask that you follow-up with your pharmacy.

## 2019-10-21 ENCOUNTER — Other Ambulatory Visit: Payer: Self-pay

## 2019-10-21 MED ORDER — METFORMIN HCL 500 MG PO TABS: 500.0000 mg | ORAL_TABLET | Freq: Every day | ORAL | 0 refills | Status: DC

## 2019-10-21 NOTE — Telephone Encounter (Signed)
Reviewed chart pt is up-to-date sent refills to [facility].../lmb  

## 2020-01-24 ENCOUNTER — Ambulatory Visit: Payer: Medicare Other | Attending: Internal Medicine

## 2020-01-24 DIAGNOSIS — Z23 Encounter for immunization: Secondary | ICD-10-CM

## 2020-01-24 NOTE — Progress Notes (Signed)
   Covid-19 Vaccination Clinic  Name:  Veronica Carrillo    MRN: [id number] DOB: 09-Jun-1945  01/24/2020  [name] was observed post Covid-19 immunization for 15 minutes without incident. She was provided with Vaccine Information Sheet and instruction to access the V-Safe system.   [name] was instructed to call 911 with any severe reactions post vaccine: Marland Kitchen Difficulty breathing  . Swelling of face and throat  . A fast heartbeat  . A bad rash all over body  . Dizziness and weakness

## 2020-01-30 ENCOUNTER — Other Ambulatory Visit: Payer: Self-pay | Admitting: Internal Medicine

## 2020-02-03 ENCOUNTER — Ambulatory Visit: Payer: Managed Care, Other (non HMO)

## 2020-03-22 ENCOUNTER — Other Ambulatory Visit: Payer: Self-pay | Admitting: Internal Medicine

## 2020-03-22 DIAGNOSIS — Z1231 Encounter for screening mammogram for malignant neoplasm of breast: Secondary | ICD-10-CM

## 2020-04-03 ENCOUNTER — Other Ambulatory Visit: Payer: Self-pay | Admitting: Internal Medicine

## 2020-04-15 NOTE — Patient Instructions (Signed)
  Blood work was ordered.     Medications changes include :  Start lisinopril 5 mg daily for your BP   Your prescription(s) have been submitted to your pharmacy. Please take as directed and contact our office if you believe you are having problem(s) with the medication(s).     Please followup in 6 months

## 2020-04-15 NOTE — Progress Notes (Signed)
Subjective:    Patient ID: Veronica Carrillo, female    DOB: December 03, 1945, 75 y.o.   MRN: [id number]  HPI The patient is here for follow up of their chronic medical problems, including DM, hyperlipidemia, IBS w/ diarrhea, depression, anxiety  She is taking all of her medications as prescribed.  She is still working.  She may cut down on how much she is working this year.  Yesterday she felt less energy, but today she feels a little better.  She overall feels well.  Medications and allergies reviewed with patient and updated if appropriate.  Patient Active Problem List   Diagnosis Date Noted  . Elevated blood pressure reading 01/30/2018  . Urinary incontinence 01/30/2018  . Irritable bowel syndrome (IBS) 03/27/2017  . Acute pain of right knee 11/28/2016  . Hyperlipidemia 10/01/2015  . Diverticulitis of colon 10/01/2015  . Diabetes ([place]) 10/01/2015  . Depression 10/01/2015  . Anxiety 10/01/2015  . Arthritis of right knee 10/01/2015    Current Outpatient Medications on File Prior to Visit  Medication Sig Dispense Refill  . HYDROcodone-acetaminophen (NORCO/VICODIN) 5-325 MG tablet Take 1 tablet by mouth every 6 (six) hours as needed.    . loperamide (IMODIUM) 2 MG capsule Take 1 capsule (2 mg total) by mouth daily. 90 capsule 1  . metFORMIN (GLUCOPHAGE) 500 MG tablet TAKE 1 TABLET BY MOUTH  DAILY WITH BREAKFAST 90 tablet 0  . rosuvastatin (CRESTOR) 40 MG tablet TAKE 1 TABLET BY MOUTH  DAILY 90 tablet 0  . venlafaxine XR (EFFEXOR-XR) 75 MG 24 hr capsule TAKE 1 CAPSULE BY MOUTH  DAILY 90 capsule 0   No current facility-administered medications on file prior to visit.    Past Medical History:  Diagnosis Date  . Anxiety   . Arthritis   . Depression   . Diabetes mellitus without complication ([place])   . Diverticulitis   . History of colon polyps   . Hyperlipidemia   . IBS (irritable bowel syndrome)     Past Surgical History:  Procedure Laterality Date  . ABDOMINAL  EXPLORATION SURGERY    . ABDOMINAL HYSTERECTOMY    . BREAST BIOPSY Left   . COLONOSCOPY    . DILATION AND CURETTAGE OF UTERUS     x 3  . POLYPECTOMY    . TONSILLECTOMY      Social History   Socioeconomic History  . Marital status: Single    Spouse name: Not on file  . Number of children: 1  . Years of education: Not on file  . Highest education level: Not on file  Occupational History  . Occupation: retired  . Occupation: PT recepionist  Tobacco Use  . Smoking status: Never Smoker  . Smokeless tobacco: Never Used  Substance and Sexual Activity  . Alcohol use: Yes    Alcohol/week: 0.0 standard drinks    Comment: wine cooler-ocassional  . Drug use: No  . Sexual activity: Not on file  Other Topics Concern  . Not on file  Social History Narrative   Exercise: [employer], walking      Moved from [place] Strain: Not on [employer] Insecurity: Not on file  Transportation Needs: Not on file  Physical Activity: Not on file  Stress: Not on file  Social Connections: Not on file    Family History  Problem Relation Age of Onset  . Hypertension Mother   . Bladder Cancer Father   . Heart  disease Father   . Diabetes Paternal Grandmother   . Colon cancer Neg Hx   . Esophageal cancer Neg Hx   . Liver cancer Neg Hx   . Pancreatic cancer Neg Hx   . Rectal cancer Neg Hx   . Stomach cancer Neg Hx     Review of Systems  Constitutional: Negative for chills and fever.  Respiratory: Negative for cough, shortness of breath and wheezing.   Cardiovascular: Negative for chest pain, palpitations and leg swelling.  Neurological: Positive for light-headedness (yesterday). Negative for headaches.       Objective:   Vitals:   04/16/20 0949  BP: (!) 146/82  Pulse: 94  Temp: 98.5 F (36.9 C)  SpO2: 98%   BP Readings from Last 3 Encounters:  04/16/20 (!) 146/82  12/02/18 (!) 144/80  10/29/18 132/84   Wt Readings from Last 3  Encounters:  04/16/20 213 lb 6.4 oz (96.8 kg)  12/02/18 210 lb (95.3 kg)  10/29/18 210 lb (95.3 kg)   Body mass index is 37.8 kg/m.   Physical Exam    Constitutional: Appears well-developed and well-nourished. No distress.  HENT:  Head: Normocephalic and atraumatic.  Neck: Neck supple. No tracheal deviation present. No thyromegaly present.  No cervical lymphadenopathy Cardiovascular: Normal rate, regular rhythm and normal heart sounds.   No murmur heard. No carotid bruit .  No edema Pulmonary/Chest: Effort normal and breath sounds normal. No respiratory distress. No has no wheezes. No rales.  Skin: Skin is warm and dry. Not diaphoretic.  Psychiatric: Normal mood and affect. Behavior is normal.      Assessment & Plan:    See Problem List for Assessment and Plan of chronic medical problems.    This visit occurred during the SARS-CoV-2 public health emergency.  Safety protocols were in place, including screening questions prior to the visit, additional usage of staff PPE, and extensive cleaning of exam room while observing appropriate contact time as indicated for disinfecting solutions.

## 2020-04-16 ENCOUNTER — Encounter: Payer: Self-pay | Admitting: Internal Medicine

## 2020-04-16 ENCOUNTER — Ambulatory Visit (INDEPENDENT_AMBULATORY_CARE_PROVIDER_SITE_OTHER): Payer: Managed Care, Other (non HMO) | Admitting: Internal Medicine

## 2020-04-16 ENCOUNTER — Other Ambulatory Visit: Payer: Self-pay

## 2020-04-16 VITALS — BP 146/82 | HR 94 | Temp 98.5°F | Ht 63.0 in | Wt 213.4 lb

## 2020-04-16 DIAGNOSIS — F419 Anxiety disorder, unspecified: Secondary | ICD-10-CM

## 2020-04-16 DIAGNOSIS — E669 Obesity, unspecified: Secondary | ICD-10-CM | POA: Insufficient documentation

## 2020-04-16 DIAGNOSIS — E7849 Other hyperlipidemia: Secondary | ICD-10-CM

## 2020-04-16 DIAGNOSIS — Z6837 Body mass index (BMI) 37.0-37.9, adult: Secondary | ICD-10-CM

## 2020-04-16 DIAGNOSIS — Z23 Encounter for immunization: Secondary | ICD-10-CM | POA: Diagnosis not present

## 2020-04-16 DIAGNOSIS — F3289 Other specified depressive episodes: Secondary | ICD-10-CM | POA: Diagnosis not present

## 2020-04-16 DIAGNOSIS — I10 Essential (primary) hypertension: Secondary | ICD-10-CM

## 2020-04-16 DIAGNOSIS — E1165 Type 2 diabetes mellitus with hyperglycemia: Secondary | ICD-10-CM

## 2020-04-16 DIAGNOSIS — K580 Irritable bowel syndrome with diarrhea: Secondary | ICD-10-CM

## 2020-04-16 DIAGNOSIS — N184 Chronic kidney disease, stage 4 (severe): Secondary | ICD-10-CM

## 2020-04-16 LAB — CBC WITH DIFFERENTIAL/PLATELET
Basophils Absolute: 0.1 10*3/uL (ref 0.0–0.1)
Basophils Relative: 1.1 % (ref 0.0–3.0)
Eosinophils Absolute: 0.2 10*3/uL (ref 0.0–0.7)
Eosinophils Relative: 2.5 % (ref 0.0–5.0)
HCT: 38.3 % (ref 36.0–46.0)
Hemoglobin: 12.9 g/dL (ref 12.0–15.0)
Lymphocytes Relative: 29.9 % (ref 12.0–46.0)
Lymphs Abs: 1.9 10*3/uL (ref 0.7–4.0)
MCHC: 33.8 g/dL (ref 30.0–36.0)
MCV: 82.8 fl (ref 78.0–100.0)
Monocytes Absolute: 0.5 10*3/uL (ref 0.1–1.0)
Monocytes Relative: 7.6 % (ref 3.0–12.0)
Neutro Abs: 3.8 10*3/uL (ref 1.4–7.7)
Neutrophils Relative %: 58.9 % (ref 43.0–77.0)
Platelets: 269 10*3/uL (ref 150.0–400.0)
RBC: 4.62 Mil/uL (ref 3.87–5.11)
RDW: 14.5 % (ref 11.5–15.5)
WBC: 6.5 10*3/uL (ref 4.0–10.5)

## 2020-04-16 LAB — LIPID PANEL
Cholesterol: 134 mg/dL (ref 0–200)
HDL: 44.2 mg/dL (ref >39.00)
NonHDL: 89.3
Total CHOL/HDL Ratio: 3
Triglycerides: 220 mg/dL — ABNORMAL HIGH (ref 0.0–149.0)
VLDL: 44 mg/dL — ABNORMAL HIGH (ref 0.0–40.0)

## 2020-04-16 LAB — COMPREHENSIVE METABOLIC PANEL
ALT: 12 U/L (ref 0–35)
AST: 20 U/L (ref 0–37)
Albumin: 4.3 g/dL (ref 3.5–5.2)
Alkaline Phosphatase: 110 U/L (ref 39–117)
BUN: 13 mg/dL (ref 6–23)
CO2: 31 mEq/L (ref 19–32)
Calcium: 9.8 mg/dL (ref 8.4–10.5)
Chloride: 106 mEq/L (ref 96–112)
Creatinine, Ser: 1.72 mg/dL — ABNORMAL HIGH (ref 0.40–1.20)
GFR: 29.12 mL/min — ABNORMAL LOW (ref >60.00)
Glucose, Bld: 175 mg/dL — ABNORMAL HIGH (ref 70–99)
Potassium: 4.7 mEq/L (ref 3.5–5.1)
Sodium: 140 mEq/L (ref 135–145)
Total Bilirubin: 0.9 mg/dL (ref 0.2–1.2)
Total Protein: 7.4 g/dL (ref 6.0–8.3)

## 2020-04-16 LAB — HEMOGLOBIN A1C: Hgb A1c MFr Bld: 7.1 % — ABNORMAL HIGH (ref 4.6–6.5)

## 2020-04-16 LAB — MICROALBUMIN / CREATININE URINE RATIO
Creatinine,U: 175.5 mg/dL
Microalb Creat Ratio: 17.9 mg/g (ref 0.0–30.0)
Microalb, Ur: 31.4 mg/dL — ABNORMAL HIGH (ref 0.0–1.9)

## 2020-04-16 LAB — LDL CHOLESTEROL, DIRECT: Direct LDL: 59 mg/dL

## 2020-04-16 MED ORDER — LISINOPRIL 5 MG PO TABS: 5.0000 mg | ORAL_TABLET | Freq: Every day | ORAL | 1 refills | Status: DC

## 2020-04-16 NOTE — Assessment & Plan Note (Signed)
Chronic Check lipid panel  Continue Crestor 40 mg daily Regular exercise and healthy diet encouraged  

## 2020-04-16 NOTE — Assessment & Plan Note (Signed)
Chronic BMI 37.8 with comorbidities of diabetes, hypertension and hyperlipidemia Stressed the importance of weight loss Encouraged regular exercise Discussed decrease portions, diabetic diet

## 2020-04-16 NOTE — Assessment & Plan Note (Signed)
Chronic Controlled, stable Continue venlafaxine 75 mg daily

## 2020-04-16 NOTE — Assessment & Plan Note (Signed)
Chronic With diarrhea Stable and controlled as long as she takes Imodium daily Continue Imodium daily

## 2020-04-16 NOTE — Assessment & Plan Note (Signed)
New problem She has had elevated blood pressure here in the past and her BP is elevated here today Start lisinopril 5 mg daily Encouraged her to monitor her BP at home Encourage regular exercise, low-sodium diet and weight loss CMP

## 2020-04-16 NOTE — Assessment & Plan Note (Signed)
Chronic Continue Metformin 500 mg daily with breakfast Check A1c, urine microalbumin We will adjust medication if needed-may need a higher dose of Metformin

## 2020-04-17 MED ORDER — RYBELSUS 3 MG PO TABS: 3.0000 mg | ORAL_TABLET | Freq: Every day | ORAL | 1 refills | Status: DC

## 2020-04-17 NOTE — Addendum Note (Signed)
Addended by: Binnie Rail on: 04/17/2020 03:14 PM   Modules accepted: Orders

## 2020-04-21 ENCOUNTER — Telehealth: Payer: Self-pay | Admitting: Internal Medicine

## 2020-04-21 NOTE — Telephone Encounter (Signed)
How about losartan 25 mg daily-different type of medication that will protect her kidneys and also reduce her blood pressure?

## 2020-04-21 NOTE — Telephone Encounter (Signed)
° ° °  Patient calling to report she does not want to take lisinopril (ZESTRIL) 5 MG tablet, afraid of possible side effects

## 2020-04-22 MED ORDER — LOSARTAN POTASSIUM 25 MG PO TABS: 25.0000 mg | ORAL_TABLET | Freq: Every day | ORAL | 1 refills | Status: DC

## 2020-04-22 MED ORDER — LOSARTAN POTASSIUM 25 MG PO TABS: 25.0000 mg | ORAL_TABLET | Freq: Every day | ORAL | 0 refills | Status: DC

## 2020-04-22 NOTE — Telephone Encounter (Signed)
Letter writtent - but in the future this needs to be discussed in person

## 2020-04-22 NOTE — Telephone Encounter (Signed)
30 days of losartan sent to [facility], 90 days sent to mail away pharmacy.   She did not mention that she was not able to go to work for a period of time.  She will have been out for 7 days-is her work going to except a basic note or she going to have to apply for disability.  If she does have to apply for disability she will need another visit because everything has to be documented.

## 2020-04-23 NOTE — Telephone Encounter (Signed)
Letter on my desk. Patient will pick up today.

## 2020-05-03 ENCOUNTER — Ambulatory Visit: Payer: Managed Care, Other (non HMO)

## 2020-05-06 ENCOUNTER — Other Ambulatory Visit: Payer: Self-pay | Admitting: Nephrology

## 2020-05-06 DIAGNOSIS — N1832 Chronic kidney disease, stage 3b: Secondary | ICD-10-CM

## 2020-05-13 ENCOUNTER — Ambulatory Visit: Payer: Managed Care, Other (non HMO)

## 2020-05-18 ENCOUNTER — Ambulatory Visit
Admission: RE | Admit: 2020-05-18 | Discharge: 2020-05-18 | Disposition: A | Discharge status: Home / Self Care | Payer: Medicare Other | Source: Ambulatory Visit | Attending: Nephrology | Admitting: Nephrology

## 2020-05-18 DIAGNOSIS — N1832 Chronic kidney disease, stage 3b: Secondary | ICD-10-CM

## 2020-05-19 ENCOUNTER — Telehealth: Payer: Self-pay | Admitting: Internal Medicine

## 2020-05-19 ENCOUNTER — Other Ambulatory Visit: Payer: Self-pay

## 2020-05-19 MED ORDER — RYBELSUS 7 MG PO TABS: 7.0000 mg | ORAL_TABLET | Freq: Every day | ORAL | 1 refills | Status: DC

## 2020-05-19 MED ORDER — RYBELSUS 3 MG PO TABS: 3.0000 mg | ORAL_TABLET | Freq: Every day | ORAL | 1 refills | Status: DC

## 2020-05-19 NOTE — Telephone Encounter (Signed)
Semaglutide [facility]) 3 MG TABS [place], [address] [place], [address] Phone:  [phone]  Fax:  [phone]     Patient requesting [facility]ill [facility], she also states ever since she started the medication she has a cough and was wondering if it could be related to the medication because she didn't have it before.

## 2020-05-19 NOTE — Telephone Encounter (Signed)
I did change her prescription to 7 mg and sent a new prescription [facility]-this will help more with sugars and hopefully help with weight loss.  This typically does not cause cough.  Sometimes it can increase the risk of heartburn, which can cause a cough, but does not cause a cough.

## 2020-05-20 NOTE — Telephone Encounter (Signed)
Left detailed message today for patient.

## 2020-05-25 ENCOUNTER — Telehealth: Payer: Self-pay

## 2020-05-25 NOTE — Telephone Encounter (Signed)
Key: [id number]  Your information has been sent to [facility].  (Pending approval)

## 2020-06-14 ENCOUNTER — Telehealth: Payer: Self-pay | Admitting: Internal Medicine

## 2020-06-14 NOTE — Telephone Encounter (Signed)
LVM for pt to rtn my call to schedule AWV with [facility]. Please schedule appt if pt calls the office.  

## 2020-06-28 ENCOUNTER — Ambulatory Visit
Admission: RE | Admit: 2020-06-28 | Discharge: 2020-06-28 | Disposition: A | Discharge status: Home / Self Care | Payer: Medicare Other | Source: Ambulatory Visit | Attending: Internal Medicine | Admitting: Internal Medicine

## 2020-06-28 ENCOUNTER — Other Ambulatory Visit: Payer: Self-pay

## 2020-06-28 DIAGNOSIS — Z1231 Encounter for screening mammogram for malignant neoplasm of breast: Secondary | ICD-10-CM

## 2020-06-29 ENCOUNTER — Ambulatory Visit: Payer: Managed Care, Other (non HMO)

## 2020-07-07 ENCOUNTER — Ambulatory Visit: Payer: Managed Care, Other (non HMO)

## 2020-07-30 ENCOUNTER — Other Ambulatory Visit: Payer: Self-pay | Admitting: Internal Medicine

## 2020-08-19 DIAGNOSIS — N184 Chronic kidney disease, stage 4 (severe): Secondary | ICD-10-CM | POA: Insufficient documentation

## 2020-08-19 DIAGNOSIS — N183 Chronic kidney disease, stage 3 unspecified: Secondary | ICD-10-CM | POA: Insufficient documentation

## 2020-08-19 NOTE — Progress Notes (Signed)
Subjective:    Patient ID: Karsten Ro, female    DOB: 1946-01-01, 75 y.o.   MRN: [id number]  HPI The patient is here for follow up of chronic medical decisions.      Medications and allergies reviewed with patient and updated if appropriate.  Patient Active Problem List   Diagnosis Date Noted  . Hypertension 04/16/2020  . Morbid obesity ([place]) 04/16/2020  . Urinary incontinence 01/30/2018  . Irritable bowel syndrome (IBS) 03/27/2017  . Acute pain of right knee 11/28/2016  . Hyperlipidemia 10/01/2015  . Diverticulitis of colon 10/01/2015  . Diabetes ([place]) 10/01/2015  . Depression 10/01/2015  . Anxiety 10/01/2015  . Arthritis of right knee 10/01/2015    Current Outpatient Medications on File Prior to Visit  Medication Sig Dispense Refill  . HYDROcodone-acetaminophen (NORCO/VICODIN) 5-325 MG tablet Take 1 tablet by mouth every 6 (six) hours as needed.    . loperamide (IMODIUM) 2 MG capsule Take 1 capsule (2 mg total) by mouth daily. 90 capsule 1  . losartan (COZAAR) 25 MG tablet Take 1 tablet (25 mg total) by mouth daily. 90 tablet 1  . rosuvastatin (CRESTOR) 40 MG tablet TAKE 1 TABLET BY MOUTH  DAILY 90 tablet 0  . Semaglutide (RYBELSUS) 7 MG TABS Take 7 mg by mouth daily before breakfast. 90 tablet 1  . venlafaxine XR (EFFEXOR-XR) 75 MG 24 hr capsule TAKE 1 CAPSULE BY MOUTH  DAILY 90 capsule 0   No current facility-administered medications on file prior to visit.    Past Medical History:  Diagnosis Date  . Anxiety   . Arthritis   . Depression   . Diabetes mellitus without complication ([place])   . Diverticulitis   . History of colon polyps   . Hyperlipidemia   . IBS (irritable bowel syndrome)     Past Surgical History:  Procedure Laterality Date  . ABDOMINAL EXPLORATION SURGERY    . ABDOMINAL HYSTERECTOMY    . BREAST BIOPSY Left   . COLONOSCOPY    . DILATION AND CURETTAGE OF UTERUS     x 3  . POLYPECTOMY    . TONSILLECTOMY      Social History    Socioeconomic History  . Marital status: Single    Spouse name: Not on file  . Number of children: 1  . Years of education: Not on file  . Highest education level: Not on file  Occupational History  . Occupation: retired  . Occupation: PT recepionist  Tobacco Use  . Smoking status: Never Smoker  . Smokeless tobacco: Never Used  Substance and Sexual Activity  . Alcohol use: Yes    Alcohol/week: 0.0 standard drinks    Comment: wine cooler-ocassional  . Drug use: No  . Sexual activity: Not on file  Other Topics Concern  . Not on file  Social History Narrative   Exercise: [employer], walking      Moved from [place] Strain: Not on [employer] Insecurity: Not on file  Transportation Needs: Not on file  Physical Activity: Not on file  Stress: Not on file  Social Connections: Not on file    Family History  Problem Relation Age of Onset  . Hypertension Mother   . Bladder Cancer Father   . Heart disease Father   . Diabetes Paternal Grandmother   . Colon cancer Neg Hx   . Esophageal cancer Neg Hx   . Liver cancer Neg Hx   .  Pancreatic cancer Neg Hx   . Rectal cancer Neg Hx   . Stomach cancer Neg Hx     Review of Systems     Objective:  There were no vitals filed for this visit. BP Readings from Last 3 Encounters:  04/16/20 (!) 146/82  12/02/18 (!) 144/80  10/29/18 132/84   Wt Readings from Last 3 Encounters:  04/16/20 213 lb 6.4 oz (96.8 kg)  12/02/18 210 lb (95.3 kg)  10/29/18 210 lb (95.3 kg)   There is no height or weight on file to calculate BMI.   Physical Exam         Assessment & Plan:    See Problem List for Assessment and Plan of chronic medical problems.    This visit occurred during the SARS-CoV-2 public health emergency.  Safety protocols were in place, including screening questions prior to the visit, additional usage of staff PPE, and extensive cleaning of exam room while observing  appropriate contact time as indicated for disinfecting solutions.   This encounter was created in error - please disregard.

## 2020-08-19 NOTE — Patient Instructions (Signed)
  Blood work was ordered.     Medications changes include :     Your prescription(s) have been submitted to your pharmacy. Please take as directed and contact our office if you believe you are having problem(s) with the medication(s).   A referral was ordered for        Someone from their office will call you to schedule an appointment.    Please followup in 6 months  

## 2020-08-20 ENCOUNTER — Encounter: Payer: Medicare Other | Admitting: Internal Medicine

## 2020-08-20 DIAGNOSIS — E7849 Other hyperlipidemia: Secondary | ICD-10-CM

## 2020-08-20 DIAGNOSIS — N1832 Chronic kidney disease, stage 3b: Secondary | ICD-10-CM

## 2020-08-30 ENCOUNTER — Other Ambulatory Visit: Payer: Self-pay | Admitting: Nephrology

## 2020-08-30 ENCOUNTER — Encounter: Payer: Self-pay | Admitting: Internal Medicine

## 2020-08-30 DIAGNOSIS — N1832 Chronic kidney disease, stage 3b: Secondary | ICD-10-CM

## 2020-08-30 NOTE — Progress Notes (Signed)
Subjective:    Patient ID: Veronica Carrillo, female    DOB: 05-11-1945, 75 y.o.   MRN: [id number]  HPI The patient is here for follow up of chronic medical conditions, including -  Htn, DM, CKD, hld, depression/anxiety  She plans on starting water aerobics.  She is walking some.  She stopped drinking soda.  She drinks water and unsweet tea.     Chokes occ during day and night - on mucus or saliva.  It does not happen that often and she is not concerned about it, but wanted to mention it.  She denies GERD.  She denies significant postnasal drainage.  She most likely will be moving back to [place].  Medications and allergies reviewed with patient and updated if appropriate.  Patient Active Problem List   Diagnosis Date Noted  . CKD (chronic kidney disease) stage 3, GFR 30-59 ml/min ([facility]) 08/19/2020  . Hypertension 04/16/2020  . Morbid obesity ([place]) 04/16/2020  . Urinary incontinence 01/30/2018  . Irritable bowel syndrome (IBS) 03/27/2017  . Acute pain of right knee 11/28/2016  . Hyperlipidemia 10/01/2015  . Diverticulitis of colon 10/01/2015  . Diabetes ([place]) 10/01/2015  . Depression 10/01/2015  . Anxiety 10/01/2015  . Arthritis of right knee 10/01/2015    Current Outpatient Medications on File Prior to Visit  Medication Sig Dispense Refill  . FARXIGA 10 MG TABS tablet Take 10 mg by mouth daily.    Marland Kitchen loperamide (IMODIUM) 2 MG capsule Take 1 capsule (2 mg total) by mouth daily. 90 capsule 1  . losartan (COZAAR) 25 MG tablet Take 1 tablet (25 mg total) by mouth daily. 90 tablet 1  . rosuvastatin (CRESTOR) 40 MG tablet TAKE 1 TABLET BY MOUTH  DAILY 90 tablet 0  . venlafaxine XR (EFFEXOR-XR) 75 MG 24 hr capsule TAKE 1 CAPSULE BY MOUTH  DAILY 90 capsule 0   No current facility-administered medications on file prior to visit.    Past Medical History:  Diagnosis Date  . Anxiety   . Arthritis   . Depression   . Diabetes mellitus without complication ([place])   .  Diverticulitis   . History of colon polyps   . Hyperlipidemia   . IBS (irritable bowel syndrome)     Past Surgical History:  Procedure Laterality Date  . ABDOMINAL EXPLORATION SURGERY    . ABDOMINAL HYSTERECTOMY    . BREAST BIOPSY Left   . COLONOSCOPY    . DILATION AND CURETTAGE OF UTERUS     x 3  . POLYPECTOMY    . TONSILLECTOMY      Social History   Socioeconomic History  . Marital status: Single    Spouse name: Not on file  . Number of children: 1  . Years of education: Not on file  . Highest education level: Not on file  Occupational History  . Occupation: retired  . Occupation: PT recepionist  Tobacco Use  . Smoking status: Never Smoker  . Smokeless tobacco: Never Used  Substance and Sexual Activity  . Alcohol use: Yes    Alcohol/week: 0.0 standard drinks    Comment: wine cooler-ocassional  . Drug use: No  . Sexual activity: Not on file  Other Topics Concern  . Not on file  Social History Narrative   Exercise: [employer], walking      Moved from [place] Strain: Not on [employer] Insecurity: Not on file  Transportation Needs: Not on file  Physical Activity: Not on file  Stress: Not on file  Social Connections: Not on file    Family History  Problem Relation Age of Onset  . Hypertension Mother   . Bladder Cancer Father   . Heart disease Father   . Diabetes Paternal Grandmother   . Colon cancer Neg Hx   . Esophageal cancer Neg Hx   . Liver cancer Neg Hx   . Pancreatic cancer Neg Hx   . Rectal cancer Neg Hx   . Stomach cancer Neg Hx     Review of Systems  Constitutional: Negative for chills and fever.  Respiratory: Positive for cough (related to mucus in throat). Negative for shortness of breath and wheezing.   Cardiovascular: Negative for chest pain, palpitations and leg swelling.  Gastrointestinal: Negative for abdominal pain and nausea.       No gerd  Neurological: Positive for dizziness (with  quick turns). Negative for light-headedness and headaches.       Objective:   Vitals:   08/31/20 0752  BP: 132/76  Pulse: 94  Temp: 98.7 F (37.1 C)  SpO2: 98%   BP Readings from Last 3 Encounters:  08/31/20 132/76  04/16/20 (!) 146/82  12/02/18 (!) 144/80   Wt Readings from Last 3 Encounters:  08/31/20 209 lb 9.6 oz (95.1 kg)  04/16/20 213 lb 6.4 oz (96.8 kg)  12/02/18 210 lb (95.3 kg)   Body mass index is 37.13 kg/m.   Physical Exam    Constitutional: Appears well-developed and well-nourished. No distress.  Head: Normocephalic and atraumatic.  Neck: Neck supple. No tracheal deviation present. No thyromegaly present.  No cervical lymphadenopathy Cardiovascular: Normal rate, regular rhythm and normal heart sounds.  No murmur heard. No carotid bruit .  No edema Pulmonary/Chest: Effort normal and breath sounds normal. No respiratory distress. No has no wheezes. No rales.  Skin: Skin is warm and dry. Not diaphoretic.  Psychiatric: Normal mood and affect. Behavior is normal.       Assessment & Plan:    See Problem List for Assessment and Plan of chronic medical problems.    This visit occurred during the SARS-CoV-2 public health emergency.  Safety protocols were in place, including screening questions prior to the visit, additional usage of staff PPE, and extensive cleaning of exam room while observing appropriate contact time as indicated for disinfecting solutions.

## 2020-08-30 NOTE — Patient Instructions (Addendum)
    Blood work was ordered.     Medications changes include :  none   Your prescription(s) have been submitted to your pharmacy. Please take as directed and contact our office if you believe you are having problem(s) with the medication(s).   Please followup in 6 months  

## 2020-08-31 ENCOUNTER — Ambulatory Visit (INDEPENDENT_AMBULATORY_CARE_PROVIDER_SITE_OTHER): Payer: Managed Care, Other (non HMO) | Admitting: Internal Medicine

## 2020-08-31 ENCOUNTER — Other Ambulatory Visit: Payer: Self-pay

## 2020-08-31 VITALS — BP 132/76 | HR 94 | Temp 98.7°F | Ht 63.0 in | Wt 209.6 lb

## 2020-08-31 DIAGNOSIS — I10 Essential (primary) hypertension: Secondary | ICD-10-CM | POA: Diagnosis not present

## 2020-08-31 DIAGNOSIS — N1832 Chronic kidney disease, stage 3b: Secondary | ICD-10-CM

## 2020-08-31 DIAGNOSIS — E1165 Type 2 diabetes mellitus with hyperglycemia: Secondary | ICD-10-CM

## 2020-08-31 DIAGNOSIS — F419 Anxiety disorder, unspecified: Secondary | ICD-10-CM

## 2020-08-31 DIAGNOSIS — E7849 Other hyperlipidemia: Secondary | ICD-10-CM

## 2020-08-31 DIAGNOSIS — K580 Irritable bowel syndrome with diarrhea: Secondary | ICD-10-CM

## 2020-08-31 DIAGNOSIS — F3289 Other specified depressive episodes: Secondary | ICD-10-CM

## 2020-08-31 LAB — COMPREHENSIVE METABOLIC PANEL
ALT: 13 U/L (ref 0–35)
AST: 17 U/L (ref 0–37)
Albumin: 4.1 g/dL (ref 3.5–5.2)
Alkaline Phosphatase: 108 U/L (ref 39–117)
BUN: 17 mg/dL (ref 6–23)
CO2: 26 mEq/L (ref 19–32)
Calcium: 9.4 mg/dL (ref 8.4–10.5)
Chloride: 104 mEq/L (ref 96–112)
Creatinine, Ser: 1.68 mg/dL — ABNORMAL HIGH (ref 0.40–1.20)
GFR: 29.88 mL/min — ABNORMAL LOW (ref >60.00)
Glucose, Bld: 173 mg/dL — ABNORMAL HIGH (ref 70–99)
Potassium: 4 mEq/L (ref 3.5–5.1)
Sodium: 140 mEq/L (ref 135–145)
Total Bilirubin: 1 mg/dL (ref 0.2–1.2)
Total Protein: 7.2 g/dL (ref 6.0–8.3)

## 2020-08-31 LAB — LIPID PANEL
Cholesterol: 178 mg/dL (ref 0–200)
HDL: 48.1 mg/dL (ref >39.00)
NonHDL: 129.6
Total CHOL/HDL Ratio: 4
Triglycerides: 206 mg/dL — ABNORMAL HIGH (ref 0.0–149.0)
VLDL: 41.2 mg/dL — ABNORMAL HIGH (ref 0.0–40.0)

## 2020-08-31 LAB — CBC WITH DIFFERENTIAL/PLATELET
Basophils Absolute: 0 10*3/uL (ref 0.0–0.1)
Basophils Relative: 0.8 % (ref 0.0–3.0)
Eosinophils Absolute: 0.2 10*3/uL (ref 0.0–0.7)
Eosinophils Relative: 3.9 % (ref 0.0–5.0)
HCT: 37.6 % (ref 36.0–46.0)
Hemoglobin: 12.9 g/dL (ref 12.0–15.0)
Lymphocytes Relative: 36.3 % (ref 12.0–46.0)
Lymphs Abs: 2 10*3/uL (ref 0.7–4.0)
MCHC: 34.2 g/dL (ref 30.0–36.0)
MCV: 82.7 fl (ref 78.0–100.0)
Monocytes Absolute: 0.4 10*3/uL (ref 0.1–1.0)
Monocytes Relative: 7.3 % (ref 3.0–12.0)
Neutro Abs: 2.9 10*3/uL (ref 1.4–7.7)
Neutrophils Relative %: 51.7 % (ref 43.0–77.0)
Platelets: 272 10*3/uL (ref 150.0–400.0)
RBC: 4.55 Mil/uL (ref 3.87–5.11)
RDW: 15.3 % (ref 11.5–15.5)
WBC: 5.6 10*3/uL (ref 4.0–10.5)

## 2020-08-31 LAB — LDL CHOLESTEROL, DIRECT: Direct LDL: 90 mg/dL

## 2020-08-31 LAB — HEMOGLOBIN A1C: Hgb A1c MFr Bld: 6.8 % — ABNORMAL HIGH (ref 4.6–6.5)

## 2020-08-31 MED ORDER — VENLAFAXINE HCL ER 75 MG PO CP24: 75.0000 mg | ORAL_CAPSULE | Freq: Every day | ORAL | 1 refills | Status: DC

## 2020-08-31 MED ORDER — LOSARTAN POTASSIUM 25 MG PO TABS: 25.0000 mg | ORAL_TABLET | Freq: Every day | ORAL | 1 refills | Status: DC

## 2020-08-31 MED ORDER — ROSUVASTATIN CALCIUM 40 MG PO TABS: 40.0000 mg | ORAL_TABLET | Freq: Every day | ORAL | 1 refills | Status: DC

## 2020-08-31 MED ORDER — FARXIGA 10 MG PO TABS: 10.0000 mg | ORAL_TABLET | Freq: Every day | ORAL | 1 refills | Status: DC

## 2020-08-31 NOTE — Assessment & Plan Note (Signed)
Chronic with diarrhea Takes Imodium daily and more as needed Overall stable and fairly controlled Has FMLA for IBS and fecal incontinence that occurs on occasion

## 2020-08-31 NOTE — Assessment & Plan Note (Signed)
Chronic Controlled, stable Continue effexor 75 mg daily  

## 2020-08-31 NOTE — Assessment & Plan Note (Signed)
Chronic Seeing nephrology cmp today Started on farxiga 10 mg daily

## 2020-08-31 NOTE — Assessment & Plan Note (Signed)
Chronic BP well controlled Continue losartan 25 mg daily cmp  

## 2020-08-31 NOTE — Assessment & Plan Note (Addendum)
Chronic On farxiga 10 mg daily Active, walking Eating better, stopped drinking soda A1c, cmp

## 2020-08-31 NOTE — Assessment & Plan Note (Signed)
Chronic Check lipid panel  Continue crestor 40 mg daily Regular exercise and healthy diet encouraged

## 2020-09-03 ENCOUNTER — Telehealth: Payer: Self-pay

## 2020-09-03 DIAGNOSIS — Z0279 Encounter for issue of other medical certificate: Secondary | ICD-10-CM

## 2020-09-03 NOTE — Telephone Encounter (Signed)
FMLA forms billed on 5.27.22

## 2020-09-03 NOTE — Telephone Encounter (Signed)
FMLA paperwork completed for [employer]. Copy given to Amy to bill. Original emailed to [email or web address] as well as [email or web address].  Patient aware that paperwork has been completed.  Original copy placed in mail today.

## 2020-09-13 ENCOUNTER — Ambulatory Visit
Admission: RE | Admit: 2020-09-13 | Discharge: 2020-09-13 | Disposition: A | Discharge status: Home / Self Care | Payer: Medicare Other | Source: Ambulatory Visit | Attending: Nephrology | Admitting: Nephrology

## 2020-09-13 DIAGNOSIS — N1832 Chronic kidney disease, stage 3b: Secondary | ICD-10-CM

## 2020-10-15 ENCOUNTER — Ambulatory Visit: Payer: Managed Care, Other (non HMO) | Admitting: Internal Medicine

## 2020-10-15 ENCOUNTER — Ambulatory Visit: Payer: Managed Care, Other (non HMO)

## 2020-10-26 ENCOUNTER — Other Ambulatory Visit: Payer: Self-pay | Admitting: Nephrology

## 2020-10-26 DIAGNOSIS — N1832 Chronic kidney disease, stage 3b: Secondary | ICD-10-CM

## 2020-10-26 DIAGNOSIS — N281 Cyst of kidney, acquired: Secondary | ICD-10-CM

## 2020-11-22 ENCOUNTER — Other Ambulatory Visit: Payer: Medicare Other

## 2020-12-20 ENCOUNTER — Telehealth: Payer: Self-pay | Admitting: Internal Medicine

## 2020-12-20 NOTE — Telephone Encounter (Signed)
[facility] FYI...   Caller states that she is COVID positive by PCR, Sx started Wed. Cough, body aches, sneezing, thirsty. Requesting an order for sx. Per directive about no meds, pt advised to go to UC for eval and tx  Advised to call PCP within 24 hrs.

## 2020-12-28 ENCOUNTER — Ambulatory Visit: Payer: Medicare Other | Admitting: Emergency Medicine

## 2021-02-18 ENCOUNTER — Other Ambulatory Visit: Payer: Self-pay | Admitting: Internal Medicine

## 2021-03-08 ENCOUNTER — Ambulatory Visit: Payer: Medicare Other | Admitting: Internal Medicine

## 2021-03-09 ENCOUNTER — Other Ambulatory Visit: Payer: Self-pay | Admitting: Nephrology

## 2021-03-09 DIAGNOSIS — I10 Essential (primary) hypertension: Secondary | ICD-10-CM

## 2021-03-09 DIAGNOSIS — N1832 Chronic kidney disease, stage 3b: Secondary | ICD-10-CM

## 2021-03-09 DIAGNOSIS — N281 Cyst of kidney, acquired: Secondary | ICD-10-CM

## 2021-03-14 NOTE — Progress Notes (Signed)
Subjective:    Patient ID: Veronica Carrillo, female    DOB: [birth year], 75 y.o.   MRN: [id number]  This visit occurred during the SARS-CoV-2 public health emergency.  Safety protocols were in place, including screening questions prior to the visit, additional usage of staff PPE, and extensive cleaning of exam room while observing appropriate contact time as indicated for disinfecting solutions.     HPI The patient is here for follow up of their chronic medical problems, including htn, DM, ckd, hld, depression/anxiety    Medications and allergies reviewed with patient and updated if appropriate.  Patient Active Problem List   Diagnosis Date Noted  . CKD (chronic kidney disease) stage 3, GFR 30-59 ml/min (HCC) 08/19/2020  . Hypertension 04/16/2020  . Morbid obesity ([place]) 04/16/2020  . Urinary incontinence 01/30/2018  . Irritable bowel syndrome (IBS) 03/27/2017  . Acute pain of right knee 11/28/2016  . Hyperlipidemia 10/01/2015  . Diverticulitis of colon 10/01/2015  . Diabetes ([place]) 10/01/2015  . Depression 10/01/2015  . Anxiety 10/01/2015  . Arthritis of right knee 10/01/2015    Current Outpatient Medications on File Prior to Visit  Medication Sig Dispense Refill  . FARXIGA 10 MG TABS tablet TAKE 1 TABLET BY MOUTH  DAILY 90 tablet 3  . loperamide (IMODIUM) 2 MG capsule Take 1 capsule (2 mg total) by mouth daily. 90 capsule 1  . losartan (COZAAR) 25 MG tablet TAKE 1 TABLET BY MOUTH  DAILY 90 tablet 3  . rosuvastatin (CRESTOR) 40 MG tablet TAKE 1 TABLET BY MOUTH  DAILY 90 tablet 3  . venlafaxine XR (EFFEXOR-XR) 75 MG 24 hr capsule TAKE 1 CAPSULE BY MOUTH  DAILY 90 capsule 3   No current facility-administered medications on file prior to visit.    Past Medical History:  Diagnosis Date  . Anxiety   . Arthritis   . Depression   . Diabetes mellitus without complication ([place])   . Diverticulitis   . History of colon polyps   . Hyperlipidemia   . IBS (irritable  bowel syndrome)     Past Surgical History:  Procedure Laterality Date  . ABDOMINAL EXPLORATION SURGERY    . ABDOMINAL HYSTERECTOMY    . BREAST BIOPSY Left   . COLONOSCOPY    . DILATION AND CURETTAGE OF UTERUS     x 3  . POLYPECTOMY    . TONSILLECTOMY      Social History   Socioeconomic History  . Marital status: Single    Spouse name: Not on file  . Number of children: 1  . Years of education: Not on file  . Highest education level: Not on file  Occupational History  . Occupation: retired  . Occupation: PT recepionist  Tobacco Use  . Smoking status: Never  . Smokeless tobacco: Never  Substance and Sexual Activity  . Alcohol use: Yes    Alcohol/week: 0.0 standard drinks    Comment: wine cooler-ocassional  . Drug use: No  . Sexual activity: Not on file  Other Topics Concern  . Not on file  Social History Narrative   Exercise: [employer], walking      Moved from [place] Strain: Not on [employer] Insecurity: Not on file  Transportation Needs: Not on file  Physical Activity: Not on file  Stress: Not on file  Social Connections: Not on file    Family History  Problem Relation Age of Onset  .  Hypertension Mother   . Bladder Cancer Father   . Heart disease Father   . Diabetes Paternal Grandmother   . Colon cancer Neg Hx   . Esophageal cancer Neg Hx   . Liver cancer Neg Hx   . Pancreatic cancer Neg Hx   . Rectal cancer Neg Hx   . Stomach cancer Neg Hx     Review of Systems     Objective:  There were no vitals filed for this visit. BP Readings from Last 3 Encounters:  08/31/20 132/76  04/16/20 (!) 146/82  12/02/18 (!) 144/80   Wt Readings from Last 3 Encounters:  08/31/20 209 lb 9.6 oz (95.1 kg)  04/16/20 213 lb 6.4 oz (96.8 kg)  12/02/18 210 lb (95.3 kg)   There is no height or weight on file to calculate BMI.   Physical Exam    Constitutional: Appears well-developed and well-nourished. No  distress.  HENT:  Head: Normocephalic and atraumatic.  Neck: Neck supple. No tracheal deviation present. No thyromegaly present.  No cervical lymphadenopathy Cardiovascular: Normal rate, regular rhythm and normal heart sounds.   No murmur heard. No carotid bruit .  No edema Pulmonary/Chest: Effort normal and breath sounds normal. No respiratory distress. No has no wheezes. No rales.  Skin: Skin is warm and dry. Not diaphoretic.  Psychiatric: Normal mood and affect. Behavior is normal.      Assessment & Plan:    See Problem List for Assessment and Plan of chronic medical problems.     This encounter was created in error - please disregard.

## 2021-03-14 NOTE — Patient Instructions (Addendum)
  Blood work was ordered.     Medications changes include :   None  Your prescription(s) have been submitted to your pharmacy. Please take as directed and contact our office if you believe you are having problem(s) with the medication(s).   A referral was ordered for        Someone from their office will call you to schedule an appointment.    Please followup in 6 months

## 2021-03-15 ENCOUNTER — Encounter: Payer: Medicare Other | Admitting: Internal Medicine

## 2021-03-15 ENCOUNTER — Encounter: Payer: Self-pay | Admitting: Internal Medicine

## 2021-03-15 DIAGNOSIS — E7849 Other hyperlipidemia: Secondary | ICD-10-CM

## 2021-03-15 DIAGNOSIS — I10 Essential (primary) hypertension: Secondary | ICD-10-CM

## 2021-03-15 DIAGNOSIS — N1832 Chronic kidney disease, stage 3b: Secondary | ICD-10-CM

## 2021-03-15 DIAGNOSIS — E1165 Type 2 diabetes mellitus with hyperglycemia: Secondary | ICD-10-CM

## 2021-03-15 DIAGNOSIS — F3289 Other specified depressive episodes: Secondary | ICD-10-CM

## 2021-03-15 DIAGNOSIS — F419 Anxiety disorder, unspecified: Secondary | ICD-10-CM

## 2021-03-15 NOTE — Assessment & Plan Note (Signed)
Chronic Regular exercise and healthy diet encouraged Check lipid panel  Continue rosuvastatin 40 mg daily 

## 2021-03-15 NOTE — Assessment & Plan Note (Signed)
Chronic Lab Results  Component Value Date   HGBA1C 6.8 (H) 08/31/2020   Sugars controlled 6 months ago Check A1c today Continue Farxiga 10 mg daily Encouraged diabetic diet, regular exercise, weight loss

## 2021-03-15 NOTE — Assessment & Plan Note (Signed)
Chronic Blood pressure well controlled CMP Continue losartan 25 mg daily 

## 2021-03-15 NOTE — Assessment & Plan Note (Signed)
Chronic Controlled, Stable Continue venlafaxine 75 mg daily 

## 2021-03-15 NOTE — Assessment & Plan Note (Signed)
Chronic Following with nephrology CMP 

## 2021-03-28 ENCOUNTER — Ambulatory Visit
Admission: RE | Admit: 2021-03-28 | Discharge: 2021-03-28 | Disposition: A | Discharge status: Home / Self Care | Payer: Managed Care, Other (non HMO) | Source: Ambulatory Visit | Attending: Nephrology | Admitting: Nephrology

## 2021-03-28 DIAGNOSIS — N281 Cyst of kidney, acquired: Secondary | ICD-10-CM

## 2021-03-28 DIAGNOSIS — N1832 Chronic kidney disease, stage 3b: Secondary | ICD-10-CM

## 2021-03-28 DIAGNOSIS — I10 Essential (primary) hypertension: Secondary | ICD-10-CM

## 2021-04-28 NOTE — Progress Notes (Signed)
Subjective:    Patient ID: Veronica Carrillo, female    DOB: 06/09/1945, 76 y.o.   MRN: [id number]  This visit occurred during the SARS-CoV-2 public health emergency.  Safety protocols were in place, including screening questions prior to the visit, additional usage of staff PPE, and extensive cleaning of exam room while observing appropriate contact time as indicated for disinfecting solutions.    HPI The patient is here for an acute visit.   Bladder issues -- she has urinary incontinence.  She often will have urine running down her leg - she can not get to the bathroom in time.  She does experience incontinence with coughing and sneezing.  She wears a pad daily.  She does not typically like to go to the bathroom in public places which makes this even more difficult.   She is overdue for a follow-up.  He states she is taking her medication daily as prescribed.  Medications and allergies reviewed with patient and updated if appropriate.  Patient Active Problem List   Diagnosis Date Noted   CKD (chronic kidney disease) stage 3, GFR 30-59 ml/min (HCC) 08/19/2020   Hypertension 04/16/2020   Morbid obesity ([place]) 04/16/2020   Urinary incontinence 01/30/2018   Irritable bowel syndrome (IBS) 03/27/2017   Acute pain of right knee 11/28/2016   Hyperlipidemia 10/01/2015   Diverticulitis of colon 10/01/2015   Diabetes ([place]) 10/01/2015   Depression 10/01/2015   Anxiety 10/01/2015   Arthritis of right knee 10/01/2015    Current Outpatient Medications on File Prior to Visit  Medication Sig Dispense Refill   FARXIGA 10 MG TABS tablet TAKE 1 TABLET BY MOUTH  DAILY 90 tablet 3   loperamide (IMODIUM) 2 MG capsule Take 1 capsule (2 mg total) by mouth daily. 90 capsule 1   losartan (COZAAR) 25 MG tablet TAKE 1 TABLET BY MOUTH  DAILY 90 tablet 3   rosuvastatin (CRESTOR) 40 MG tablet TAKE 1 TABLET BY MOUTH  DAILY 90 tablet 3   No current facility-administered medications on file prior to  visit.    Past Medical History:  Diagnosis Date   Anxiety    Arthritis    Depression    Diabetes mellitus without complication (HCC)    Diverticulitis    History of colon polyps    Hyperlipidemia    IBS (irritable bowel syndrome)     Past Surgical History:  Procedure Laterality Date   ABDOMINAL EXPLORATION SURGERY     ABDOMINAL HYSTERECTOMY     BREAST BIOPSY Left    COLONOSCOPY     DILATION AND CURETTAGE OF UTERUS     x 3   POLYPECTOMY     TONSILLECTOMY      Social History   Socioeconomic History   Marital status: Single    Spouse name: Not on file   Number of children: 1   Years of education: Not on file   Highest education level: Not on file  Occupational History   Occupation: retired   Occupation: PT recepionist  Tobacco Use   Smoking status: Never   Smokeless tobacco: Never  Substance and Sexual Activity   Alcohol use: Yes    Alcohol/week: 0.0 standard drinks    Comment: wine cooler-ocassional   Drug use: No   Sexual activity: Not on file  Other Topics Concern   Not on file  Social History Narrative   Exercise: [employer], walking      Moved from [place]  Financial Resource Strain: Not on file  Food Insecurity: Not on file  Transportation Needs: Not on file  Physical Activity: Not on file  Stress: Not on file  Social Connections: Not on file    Family History  Problem Relation Age of Onset   Hypertension Mother    Bladder Cancer Father    Heart disease Father    Diabetes Paternal Grandmother    Colon cancer Neg Hx    Esophageal cancer Neg Hx    Liver cancer Neg Hx    Pancreatic cancer Neg Hx    Rectal cancer Neg Hx    Stomach cancer Neg Hx     Review of Systems  Constitutional:  Negative for fever.  Respiratory:  Negative for shortness of breath.   Cardiovascular:  Negative for chest pain, palpitations and leg swelling.  Genitourinary:  Positive for urgency. Negative for dysuria, frequency and hematuria.        Stress and urge incontinence  Neurological:  Negative for light-headedness and headaches.      Objective:   Vitals:   04/29/21 0900  BP: 138/78  Pulse: (!) 102  Temp: 98 F (36.7 C)  SpO2: 97%   BP Readings from Last 3 Encounters:  04/29/21 138/78  08/31/20 132/76  04/16/20 (!) 146/82   Wt Readings from Last 3 Encounters:  04/29/21 216 lb (98 kg)  08/31/20 209 lb 9.6 oz (95.1 kg)  04/16/20 213 lb 6.4 oz (96.8 kg)   Body mass index is 38.26 kg/m.   Physical Exam    Constitutional: Appears well-developed and well-nourished. No distress.  Head: Normocephalic and atraumatic.  Neck: Neck supple. No tracheal deviation present. No thyromegaly present.  No cervical lymphadenopathy Cardiovascular: Normal rate, regular rhythm and normal heart sounds.  No murmur heard. No carotid bruit .  No edema Pulmonary/Chest: Effort normal and breath sounds normal. No respiratory distress. No has no wheezes. No rales.  Skin: Skin is warm and dry. Not diaphoretic.  Psychiatric: Normal mood and affect. Behavior is normal.       Assessment & Plan:    See Problem List for Assessment and Plan of chronic medical problems.

## 2021-04-28 NOTE — Patient Instructions (Addendum)
°  Blood work was ordered.     Medications changes include :   increase effexor to 150 mg daily   A referral was ordered for Urogyn at [facility].        Someone from their office will call you to schedule an appointment.    Please followup in 6 months

## 2021-04-29 ENCOUNTER — Other Ambulatory Visit: Payer: Self-pay

## 2021-04-29 ENCOUNTER — Ambulatory Visit (INDEPENDENT_AMBULATORY_CARE_PROVIDER_SITE_OTHER): Payer: Managed Care, Other (non HMO) | Admitting: Internal Medicine

## 2021-04-29 ENCOUNTER — Encounter: Payer: Self-pay | Admitting: Internal Medicine

## 2021-04-29 VITALS — BP 138/78 | HR 102 | Temp 98.0°F | Ht 63.0 in | Wt 216.0 lb

## 2021-04-29 DIAGNOSIS — I10 Essential (primary) hypertension: Secondary | ICD-10-CM

## 2021-04-29 DIAGNOSIS — N3946 Mixed incontinence: Secondary | ICD-10-CM

## 2021-04-29 DIAGNOSIS — E7849 Other hyperlipidemia: Secondary | ICD-10-CM | POA: Diagnosis not present

## 2021-04-29 DIAGNOSIS — F419 Anxiety disorder, unspecified: Secondary | ICD-10-CM

## 2021-04-29 DIAGNOSIS — E1165 Type 2 diabetes mellitus with hyperglycemia: Secondary | ICD-10-CM

## 2021-04-29 DIAGNOSIS — F3289 Other specified depressive episodes: Secondary | ICD-10-CM | POA: Diagnosis not present

## 2021-04-29 DIAGNOSIS — N184 Chronic kidney disease, stage 4 (severe): Secondary | ICD-10-CM

## 2021-04-29 LAB — COMPREHENSIVE METABOLIC PANEL
ALT: 17 U/L (ref 0–35)
AST: 20 U/L (ref 0–37)
Albumin: 4 g/dL (ref 3.5–5.2)
Alkaline Phosphatase: 135 U/L — ABNORMAL HIGH (ref 39–117)
BUN: 19 mg/dL (ref 6–23)
CO2: 28 mEq/L (ref 19–32)
Calcium: 9.1 mg/dL (ref 8.4–10.5)
Chloride: 105 mEq/L (ref 96–112)
Creatinine, Ser: 1.67 mg/dL — ABNORMAL HIGH (ref 0.40–1.20)
GFR: 29.95 mL/min — ABNORMAL LOW (ref >60.00)
Glucose, Bld: 280 mg/dL — ABNORMAL HIGH (ref 70–99)
Potassium: 4.2 mEq/L (ref 3.5–5.1)
Sodium: 139 mEq/L (ref 135–145)
Total Bilirubin: 0.7 mg/dL (ref 0.2–1.2)
Total Protein: 7.2 g/dL (ref 6.0–8.3)

## 2021-04-29 LAB — LIPID PANEL
Cholesterol: 176 mg/dL (ref 0–200)
HDL: 54.7 mg/dL (ref >39.00)
LDL Cholesterol: 90 mg/dL (ref 0–99)
NonHDL: 121.73
Total CHOL/HDL Ratio: 3
Triglycerides: 161 mg/dL — ABNORMAL HIGH (ref 0.0–149.0)
VLDL: 32.2 mg/dL (ref 0.0–40.0)

## 2021-04-29 LAB — HEMOGLOBIN A1C: Hgb A1c MFr Bld: 7.4 % — ABNORMAL HIGH (ref 4.6–6.5)

## 2021-04-29 MED ORDER — VENLAFAXINE HCL ER 150 MG PO CP24: 150.0000 mg | ORAL_CAPSULE | Freq: Every day | ORAL | 1 refills | Status: DC

## 2021-04-29 NOTE — Assessment & Plan Note (Signed)
Chronic Depression not ideally controlled-she states she does feel depressed at times-she has had a couple of family members or friends die and that has really taken a toll on her. Discussed possibly increasing venlafaxine and she is interested in that-increase to 150 mg daily

## 2021-04-29 NOTE — Assessment & Plan Note (Signed)
Chronic BMI 38.26 with diabetes, hypertension and hyperlipidemia Encouraged weight loss Advised regular exercise, healthy diet

## 2021-04-29 NOTE — Assessment & Plan Note (Signed)
Chronic Following with nephrology Stressed good sugar control, blood pressure control, weight loss

## 2021-04-29 NOTE — Assessment & Plan Note (Signed)
Chronic Blood pressure well controlled CMP Continue losartan 25 mg daily 

## 2021-04-29 NOTE — Assessment & Plan Note (Signed)
Chronic Anxiety controlled Depression not controlled so we will be increasing the Effexor to 150 mg daily

## 2021-04-29 NOTE — Assessment & Plan Note (Signed)
Chronic Symptoms have worsened Experiencing both stress and urge incontinence She does not like going to the bathroom in public places which makes this more difficult She is wearing a pad every day Encourage going to bathroom frequently, weight loss, avoiding drinking caffeine Discussed treatment options She is interested in seeing urogynecology-referral ordered

## 2021-04-29 NOTE — Assessment & Plan Note (Signed)
Chronic Regular exercise and healthy diet encouraged Check lipid panel  Continue rosuvastatin 40 mg daily 

## 2021-04-29 NOTE — Assessment & Plan Note (Signed)
Chronic Sugars currently diet controlled Will check A1c

## 2021-06-03 ENCOUNTER — Other Ambulatory Visit: Payer: Self-pay | Admitting: Internal Medicine

## 2021-06-03 DIAGNOSIS — Z1231 Encounter for screening mammogram for malignant neoplasm of breast: Secondary | ICD-10-CM

## 2021-06-19 ENCOUNTER — Encounter: Payer: Self-pay | Admitting: Internal Medicine

## 2021-06-19 NOTE — Progress Notes (Signed)
? ? ? ? ?Subjective:  ? ? Patient ID: Veronica Carrillo, female    DOB: 1945/10/16, 76 y.o.   MRN: [id number] ? ?This visit occurred during the SARS-CoV-2 public health emergency.  Safety protocols were in place, including screening questions prior to the visit, additional usage of staff PPE, and extensive cleaning of exam room while observing appropriate contact time as indicated for disinfecting solutions.   ? ? ?HPI ?Veronica Carrillo is here for follow up of her chronic medical problems, including htn, DM, hld, anxiety, depression, CKD ? ?She is doing water aerobics 3 times a week.  She is on furlough from work for 2 months.  She is interested in a injectable DM medication to help more with weight loss.  ? ? ?She is wondering about seeing a therapist.  She has been through situation with her son and daughter-in-law but is very upsetting and she would like to talk to someone about it.  This has caused some increased anxiety depression and frustration.  She is taking venlafaxine daily, but unsure how much is helping. ? ? ?Medications and allergies reviewed with patient and updated if appropriate. ? ?Current Outpatient Medications on File Prior to Visit  ?Medication Sig Dispense Refill  ? FARXIGA 10 MG TABS tablet TAKE 1 TABLET BY MOUTH  DAILY 90 tablet 3  ? loperamide (IMODIUM) 2 MG capsule Take 1 capsule (2 mg total) by mouth daily. 90 capsule 1  ? losartan (COZAAR) 25 MG tablet TAKE 1 TABLET BY MOUTH  DAILY 90 tablet 3  ? rosuvastatin (CRESTOR) 40 MG tablet TAKE 1 TABLET BY MOUTH  DAILY 90 tablet 3  ? venlafaxine XR (EFFEXOR XR) 150 MG 24 hr capsule Take 1 capsule (150 mg total) by mouth daily with breakfast. 90 capsule 1  ? ?No current facility-administered medications on file prior to visit.  ? ? ? ?Review of Systems  ?Constitutional:  Negative for chills and fever.  ?Respiratory:  Negative for cough, shortness of breath and wheezing.   ?Cardiovascular:  Negative for chest pain, palpitations and leg swelling.   ?Neurological:  Negative for light-headedness and headaches.  ? ?   ?Objective:  ? ?Vitals:  ? 06/20/21 1349  ?BP: 130/74  ?Pulse: 76  ?Temp: 97.7 ?F (36.5 ?C)  ?SpO2: 98%  ? ?BP Readings from Last 3 Encounters:  ?06/20/21 130/74  ?04/29/21 138/78  ?08/31/20 132/76  ? ?Wt Readings from Last 3 Encounters:  ?06/20/21 222 lb (100.7 kg)  ?04/29/21 216 lb (98 kg)  ?08/31/20 209 lb 9.6 oz (95.1 kg)  ? ?Body mass index is 39.33 kg/m?. ? ?  ?Physical Exam ?Constitutional:   ?   General: She is not in acute distress. ?   Appearance: Normal appearance.  ?HENT:  ?   Head: Normocephalic and atraumatic.  ?Eyes:  ?   Conjunctiva/sclera: Conjunctivae normal.  ?Cardiovascular:  ?   Rate and Rhythm: Normal rate and regular rhythm.  ?   Heart sounds: Normal heart sounds. No murmur heard. ?Pulmonary:  ?   Effort: Pulmonary effort is normal. No respiratory distress.  ?   Breath sounds: Normal breath sounds. No wheezing.  ?Musculoskeletal:  ?   Cervical back: Neck supple.  ?   Right lower leg: No edema.  ?   Left lower leg: No edema.  ?Lymphadenopathy:  ?   Cervical: No cervical adenopathy.  ?Skin: ?   Findings: No rash.  ?Neurological:  ?   Mental Status: She is alert. Mental status is at baseline.  ?  Psychiatric:     ?   Mood and Affect: Mood normal.     ?   Behavior: Behavior normal.  ? ?   ? ?Lab Results  ?Component Value Date  ? WBC 5.6 08/31/2020  ? HGB 12.9 08/31/2020  ? HCT 37.6 08/31/2020  ? PLT 272.0 08/31/2020  ? GLUCOSE 280 (H) 04/29/2021  ? CHOL 176 04/29/2021  ? TRIG 161.0 (H) 04/29/2021  ? HDL 54.70 04/29/2021  ? LDLDIRECT 90.0 08/31/2020  ? [place] 90 04/29/2021  ? ALT 17 04/29/2021  ? AST 20 04/29/2021  ? NA 139 04/29/2021  ? K 4.2 04/29/2021  ? CL 105 04/29/2021  ? CREATININE 1.67 (H) 04/29/2021  ? BUN 19 04/29/2021  ? CO2 28 04/29/2021  ? TSH 2.23 10/01/2015  ? HGBA1C 7.4 (H) 04/29/2021  ? MICROALBUR 31.4 (H) 04/16/2020  ? ? ? ?Assessment & Plan:  ? ? ?See Problem List for Assessment and Plan of chronic medical  problems.  ? ? ?

## 2021-06-19 NOTE — Patient Instructions (Addendum)
? ? ? ? ?  Medications changes include :   mounjaro 2.5 mg weekly -- will increase monthly ? ? ?Your prescription(s) have been sent to your pharmacy.  ? ? ?A referral was ordered for therapist.     Someone from that office will call you to schedule an appointment.  ? ? ?Return for follow up as scheduled. ? ?

## 2021-06-20 ENCOUNTER — Other Ambulatory Visit: Payer: Self-pay

## 2021-06-20 ENCOUNTER — Ambulatory Visit (INDEPENDENT_AMBULATORY_CARE_PROVIDER_SITE_OTHER): Payer: Managed Care, Other (non HMO) | Admitting: Internal Medicine

## 2021-06-20 VITALS — BP 130/74 | HR 76 | Temp 97.7°F | Ht 63.0 in | Wt 222.0 lb

## 2021-06-20 DIAGNOSIS — N184 Chronic kidney disease, stage 4 (severe): Secondary | ICD-10-CM

## 2021-06-20 DIAGNOSIS — F3289 Other specified depressive episodes: Secondary | ICD-10-CM | POA: Diagnosis not present

## 2021-06-20 DIAGNOSIS — F419 Anxiety disorder, unspecified: Secondary | ICD-10-CM

## 2021-06-20 DIAGNOSIS — E1165 Type 2 diabetes mellitus with hyperglycemia: Secondary | ICD-10-CM | POA: Diagnosis not present

## 2021-06-20 DIAGNOSIS — I10 Essential (primary) hypertension: Secondary | ICD-10-CM

## 2021-06-20 DIAGNOSIS — E7849 Other hyperlipidemia: Secondary | ICD-10-CM

## 2021-06-20 MED ORDER — TIRZEPATIDE 2.5 MG/0.5ML ~~LOC~~ SOAJ: 2.5000 mg | SUBCUTANEOUS | 0 refills | Status: DC

## 2021-06-20 NOTE — Assessment & Plan Note (Signed)
Chronic Blood pressure well-controlled Continue losartan 25 mg daily 

## 2021-06-20 NOTE — Assessment & Plan Note (Signed)
Chronic ?Some increase in anxiety related to a family issue ?She is interested in seeing a therapist-referral ordered ?We will continue Effexor at 150 mg daily for now ?

## 2021-06-20 NOTE — Assessment & Plan Note (Signed)
Chronic ?Some increase in depression related to a family issue ?She is interested in seeing a therapist-referral ordered ?We will continue Effexor at 150 mg daily for now ?

## 2021-06-20 NOTE — Assessment & Plan Note (Signed)
Chronic ?Last sugars not ideally controlled ?Has chronic kidney disease and limited with what she can take.   ?Continue Farxiga 10 mg daily ?She is interested in an injectable medication that may also help with weight loss ?Start Mounjaro 2.5 mg weekly-we will titrate as tolerated ?

## 2021-06-23 ENCOUNTER — Telehealth: Payer: Self-pay | Admitting: Internal Medicine

## 2021-06-23 NOTE — Telephone Encounter (Signed)
Left message for patient to call back to schedule Medicare Annual Wellness Visit  ? ?Last AWV  09/25/16 ? ?Please schedule at anytime with [facility] if patient calls the office back.   ? ?40 Minutes appointment  ? ?Any questions, please call me at [phone]  ?

## 2021-06-24 ENCOUNTER — Ambulatory Visit (INDEPENDENT_AMBULATORY_CARE_PROVIDER_SITE_OTHER): Payer: Managed Care, Other (non HMO) | Admitting: Obstetrics and Gynecology

## 2021-06-24 ENCOUNTER — Other Ambulatory Visit (HOSPITAL_COMMUNITY)
Admission: RE | Admit: 2021-06-24 | Discharge: 2021-06-24 | Disposition: A | Discharge status: Home / Self Care | Payer: Managed Care, Other (non HMO) | Source: Ambulatory Visit | Attending: Obstetrics and Gynecology | Admitting: Obstetrics and Gynecology

## 2021-06-24 ENCOUNTER — Encounter: Payer: Self-pay | Admitting: Obstetrics and Gynecology

## 2021-06-24 ENCOUNTER — Other Ambulatory Visit: Payer: Self-pay

## 2021-06-24 VITALS — BP 131/80 | HR 76 | Ht 63.0 in | Wt 200.0 lb

## 2021-06-24 DIAGNOSIS — R350 Frequency of micturition: Secondary | ICD-10-CM

## 2021-06-24 DIAGNOSIS — R319 Hematuria, unspecified: Secondary | ICD-10-CM

## 2021-06-24 DIAGNOSIS — N3281 Overactive bladder: Secondary | ICD-10-CM

## 2021-06-24 DIAGNOSIS — R159 Full incontinence of feces: Secondary | ICD-10-CM

## 2021-06-24 LAB — POCT URINALYSIS DIPSTICK
Appearance: NORMAL
Bilirubin, UA: NEGATIVE
Glucose, UA: POSITIVE — AB
Ketones, UA: NEGATIVE
Leukocytes, UA: NEGATIVE
Nitrite, UA: NEGATIVE
Protein, UA: POSITIVE — AB
Spec Grav, UA: 1.025 (ref 1.010–1.025)
Urobilinogen, UA: 0.2 E.U./dL
pH, UA: 6 (ref 5.0–8.0)

## 2021-06-24 NOTE — Patient Instructions (Signed)

## 2021-06-24 NOTE — Progress Notes (Signed)
[facility] ?New Patient Evaluation and Consultation ? ?Referring Provider: Binnie Rail, MD ?PCP: Binnie Rail, MD ?Date of Service: 06/24/2021 ? ?SUBJECTIVE ?Chief Complaint: New Patient (Initial Visit) (Urinary incontinence) ? ?History of Present Illness: Veronica Carrillo is a 76 y.o. Black or African-American female seen in consultation at the request of Dr. Quay Burow for evaluation of incontinence.   ? ?Review of records from Dr Quay Burow significant for: ?Has urinary incontinence, cannot get to the bathroom in time. Also leaks with cough and sneeze. ? ?Urinary Symptoms: ?Leaks urine with going from sitting to standing, with movement to the bathroom, and with urgency ?Leaks each time she goes to the bathroom ?Pad use: 3 pads per day.   ?She is bothered by her UI symptoms. ? ?Day time voids 3-4.  Nocturia: 3 times per night to void. ?Voiding dysfunction: she does not empty her bladder well.  ?does not use a catheter to empty bladder.  ?When urinating, she feels dribbling after finishing ?Drinks: 1 cup coffee in AM, water, drinking very little soda ? ?UTIs:  0  UTI's in the last year.   ?Denies history of blood in urine and kidney or bladder stones ? ?Pelvic Organ Prolapse Symptoms:                  ?She Denies a feeling of a bulge the vaginal area.  ? ?Bowel Symptom: ?Bowel movements: 1 time(s) per day ?Stool consistency: hard ?Straining: no.  ?Splinting: yes.  ?Incomplete evacuation: no.  ?She Admits to accidental bowel leakage / fecal incontinence ? Occurs: a few times a week- if she sneezes ? Consistency with leakage: solid- has small pellets come out ?Bowel regimen:  imodium  due to IBS ?Last colonoscopy: Date 2019- neg ? ?Sexual Function ?Sexually active: yes.  ?Sexual orientation: Straight ?Pain with sex: No ? ?Pelvic Pain ?Denies pelvic pain ? ? ? ?Past Medical History:  ?Past Medical History:  ?Diagnosis Date  ? Anxiety   ? Arthritis   ? Depression   ? Diabetes mellitus without  complication ([place])   ? Diverticulitis   ? History of colon polyps   ? Hyperlipidemia   ? IBS (irritable bowel syndrome)   ? ? ? ?Past Surgical History:   ?Past Surgical History:  ?Procedure Laterality Date  ? ABDOMINAL EXPLORATION SURGERY    ? ABDOMINAL HYSTERECTOMY    ? BREAST BIOPSY Left   ? COLONOSCOPY    ? DILATION AND CURETTAGE OF UTERUS    ? x 3  ? POLYPECTOMY    ? TONSILLECTOMY    ? ? ? ?Past OB/GYN History: ?OB History  ?Gravida Para Term Preterm AB Living  ?2 1     1 1   ?SAB IAB Ectopic Multiple Live Births  ?    1      ?  ?# Outcome Date GA Lbr Len/2nd Weight Sex Delivery Anes PTL Lv  ?2 Ectopic           ?1 Para           ? ? ?Vaginal deliveries: 1 ?S/p hysterectomy ? ? ?Medications: She has a current medication list which includes the following prescription(s): farxiga, loperamide, losartan, rosuvastatin, venlafaxine xr, and tirzepatide.  ? ?Allergies: Patient is allergic to codeine.  ? ?Social History:  ?Social History  ? ?Tobacco Use  ? Smoking status: Never  ? Smokeless tobacco: Never  ?Vaping Use  ? Vaping Use: Never used  ?Substance Use Topics  ? Alcohol use: Yes  ?  Alcohol/week: 0.0 standard drinks  ?  Comment: wine cooler-ocassional  ? Drug use: No  ? ? ?Relationship status: single ?She is employed as an Educational psychologist. ?Regular exercise: No ?History of abuse: No ? ?Family History:   ?Family History  ?Problem Relation Age of Onset  ? Hypertension Mother   ? Bladder Cancer Father   ? Heart disease Father   ? Diabetes Paternal Grandmother   ? Colon cancer Neg Hx   ? Esophageal cancer Neg Hx   ? Liver cancer Neg Hx   ? Pancreatic cancer Neg Hx   ? Rectal cancer Neg Hx   ? Stomach cancer Neg Hx   ? ? ? ?Review of Systems: Review of Systems  ?Constitutional:  Negative for fever, malaise/fatigue and weight loss.  ?Respiratory:  Negative for cough, shortness of breath and wheezing.   ?Cardiovascular:  Negative for chest pain, palpitations and leg swelling.  ?Gastrointestinal:  Negative for abdominal  pain and blood in stool.  ?Genitourinary:  Negative for dysuria.  ?Musculoskeletal:  Negative for myalgias.  ?Skin:  Negative for rash.  ?Neurological:  Negative for dizziness and headaches.  ?Endo/Heme/Allergies:  Does not bruise/bleed easily.  ?Psychiatric/Behavioral:  Negative for depression. The patient is not nervous/anxious.   ? ? ?OBJECTIVE ?Physical Exam: ?Vitals:  ? 06/24/21 1340  ?BP: 131/80  ?Pulse: 76  ?Weight: 200 lb (90.7 kg)  ?Height: 5\' 3"  (1.6 m)  ? ? ?Physical Exam ?Constitutional:   ?   General: She is not in acute distress. ?Pulmonary:  ?   Effort: Pulmonary effort is normal.  ?Abdominal:  ?   General: There is no distension.  ?   Palpations: Abdomen is soft.  ?   Tenderness: There is no abdominal tenderness. There is no rebound.  ?Musculoskeletal:     ?   General: No swelling. Normal range of motion.  ?Skin: ?   General: Skin is warm and dry.  ?   Findings: No rash.  ?Neurological:  ?   Mental Status: She is alert and oriented to person, place, and time.  ?Psychiatric:     ?   Mood and Affect: Mood normal.     ?   Behavior: Behavior normal.  ? ? ? ?GU / Detailed Urogynecologic Evaluation:  ?Pelvic Exam: Normal external female genitalia; Bartholin's and Skene's glands normal in appearance; urethral meatus normal in appearance, no urethral masses or discharge.  ? ?CST: negative ? ?s/p hysterectomy: Speculum exam reveals normal vaginal mucosa with  atrophy and normal vaginal cuff.  Adnexa no mass, fullness, tenderness.   ? ? ?Pelvic floor strength I/V, puborectalis I/V external anal sphincter I/V ? ?Pelvic floor musculature: Right levator non-tender, Right obturator non-tender, Left levator non-tender, Left obturator non-tender ? ?POP-Q:  ? ?POP-Q ? ?-3  ?                                          Aa   ?-3 ?                                          Ba  ?-8  ?  C  ? ?2  ?                                          Gh  ?4  ?                                           Pb  ?8  ?                                          tvl  ? ?-3  ?                                          Ap  ?-3  ?                                          Bp  ?   ?                                            D  ? ? ? ?Rectal Exam:  ?Decreased sphincter tone, hard stool in rectum, no distal rectocele, enterocoele not present, no rectal masses, no sign of dyssynergia when asking the patient to bear down. ? ?Post-Void Residual (PVR) by Bladder Scan: ?In order to evaluate bladder emptying, we discussed obtaining a postvoid residual and she agreed to this procedure. ? ?Procedure: The ultrasound unit was placed on the patient's abdomen in the suprapubic region after the patient had voided. A PVR of 34 ml was obtained by bladder scan. ? ?Laboratory Results: ?POC urine: small blood, glucose ? ? ?ASSESSMENT AND PLAN ?Ms. Joplin is a 76 y.o. with:  ?1. Overactive bladder   ?2. Urinary frequency   ?3. Incontinence of feces, unspecified fecal incontinence type   ?4. Hematuria, unspecified type   ? ?OAB ?- We discussed the symptoms of overactive bladder (OAB), which include urinary urgency, urinary frequency, nocturia, with or without urge incontinence.  While we do not know the exact etiology of OAB, several treatment options exist. We discussed management including behavioral therapy (decreasing bladder irritants, urge suppression strategies, timed voids, bladder retraining), physical therapy, medication.  ?- She would like to start with physical therapy, referral placed.  ? ?2. Fecal incontinence ?- Treatment options include anti-diarrhea medication (loperamide/ Imodium OTC or prescription lomotil), fiber supplements, physical therapy, and possible sacral neuromodulation or surgery.   ?- Already taking imodium for loose stools.  ?- Will start with pelvic PT ?- May need to decrease imodium to relieve constipation ? ?3. Blood in urine ?- noted on POC testing- will send for UA micro and culture for confirmation ? ?Return if  symptoms are not improving with physical therapy ? ?Jaquita Folds, MD ? ? ? ? ?

## 2021-06-27 LAB — URINALYSIS, ROUTINE W REFLEX MICROSCOPIC
Bilirubin Urine: NEGATIVE
Cellular Cast, UA: 1
Glucose, UA: >=500 mg/dL — AB
Ketones, ur: NEGATIVE mg/dL
Leukocytes,Ua: NEGATIVE
Nitrite: NEGATIVE
Protein, ur: 100 mg/dL — AB
Specific Gravity, Urine: 1.024 (ref 1.005–1.030)
pH: 6 (ref 5.0–8.0)

## 2021-06-29 ENCOUNTER — Ambulatory Visit
Admission: RE | Admit: 2021-06-29 | Discharge: 2021-06-29 | Disposition: A | Discharge status: Home / Self Care | Payer: Managed Care, Other (non HMO) | Source: Ambulatory Visit | Attending: Internal Medicine | Admitting: Internal Medicine

## 2021-06-29 ENCOUNTER — Ambulatory Visit (INDEPENDENT_AMBULATORY_CARE_PROVIDER_SITE_OTHER): Payer: Managed Care, Other (non HMO)

## 2021-06-29 DIAGNOSIS — Z1231 Encounter for screening mammogram for malignant neoplasm of breast: Secondary | ICD-10-CM

## 2021-06-29 DIAGNOSIS — Z0000 Encounter for general adult medical examination without abnormal findings: Secondary | ICD-10-CM | POA: Diagnosis not present

## 2021-06-29 LAB — URINE CULTURE

## 2021-06-29 NOTE — Progress Notes (Signed)
?I connected with Veronica Carrillo today by telephone and verified that I am speaking with the correct person using two identifiers. ?Location patient: home ?Location provider: work ?Persons participating in the virtual visit: patient, provider. ?  ?I discussed the limitations, risks, security and privacy concerns of performing an evaluation and management service by telephone and the availability of in person appointments. I also discussed with the patient that there may be a patient responsible charge related to this service. The patient expressed understanding and verbally consented to this telephonic visit.  ?  ?Interactive audio and video telecommunications were attempted between this provider and patient, however failed, due to patient having technical difficulties OR patient did not have access to video capability.  We continued and completed visit with audio only. ? ?Some vital signs may be absent or patient reported.  ? ?Time Spent with patient on telephone encounter: 40 minutes ? ?Subjective:  ? Veronica Carrillo is a 76 y.o. female who presents for Medicare Annual (Subsequent) preventive examination. ? ?Review of Systems    ? ?Cardiac Risk Factors include: advanced age (>76men, >53 women);diabetes mellitus;dyslipidemia;hypertension;obesity (BMI >30kg/m2);family history of premature cardiovascular disease ? ?   ?Objective:  ?  ?There were no vitals filed for this visit. ?There is no height or weight on file to calculate BMI. ? ? ?  06/29/2021  ? 10:08 AM  ?Advanced Directives  ?Does Patient Have a Medical Advance Directive? No  ?Would patient like information on creating a medical advance directive? No - Patient declined  ? ? ?Current Medications (verified) ?Outpatient Encounter Medications as of 06/29/2021  ?Medication Sig  ? FARXIGA 10 MG TABS tablet TAKE 1 TABLET BY MOUTH  DAILY  ? loperamide (IMODIUM) 2 MG capsule Take 1 capsule (2 mg total) by mouth daily.  ? losartan (COZAAR) 25 MG tablet TAKE 1  TABLET BY MOUTH  DAILY  ? rosuvastatin (CRESTOR) 40 MG tablet TAKE 1 TABLET BY MOUTH  DAILY  ? venlafaxine XR (EFFEXOR XR) 150 MG 24 hr capsule Take 1 capsule (150 mg total) by mouth daily with breakfast.  ? tirzepatide (MOUNJARO) 2.5 MG/0.5ML Pen Inject 2.5 mg into the skin once a week. (Patient not taking: Reported on 06/24/2021)  ? ?No facility-administered encounter medications on file as of 06/29/2021.  ? ? ?Allergies (verified) ?Codeine  ? ?History: ?Past Medical History:  ?Diagnosis Date  ? Anxiety   ? Arthritis   ? Depression   ? Diabetes mellitus without complication ([place])   ? Diverticulitis   ? History of colon polyps   ? Hyperlipidemia   ? IBS (irritable bowel syndrome)   ? ?Past Surgical History:  ?Procedure Laterality Date  ? ABDOMINAL EXPLORATION SURGERY    ? ABDOMINAL HYSTERECTOMY    ? BREAST BIOPSY Left   ? COLONOSCOPY    ? DILATION AND CURETTAGE OF UTERUS    ? x 3  ? POLYPECTOMY    ? TONSILLECTOMY    ? ?Family History  ?Problem Relation Age of Onset  ? Hypertension Mother   ? Bladder Cancer Father   ? Heart disease Father   ? Diabetes Paternal Grandmother   ? Colon cancer Neg Hx   ? Esophageal cancer Neg Hx   ? Liver cancer Neg Hx   ? Pancreatic cancer Neg Hx   ? Rectal cancer Neg Hx   ? Stomach cancer Neg Hx   ? ?Social History  ? ?Socioeconomic History  ? Marital status: Single  ?  Spouse name: Not on file  ?  Number of children: 1  ? Years of education: Not on file  ? Highest education level: Not on file  ?Occupational History  ? Occupation: retired  ? Occupation: PT recepionist  ?Tobacco Use  ? Smoking status: Never  ? Smokeless tobacco: Never  ?Vaping Use  ? Vaping Use: Never used  ?Substance and Sexual Activity  ? Alcohol use: Yes  ?  Alcohol/week: 0.0 standard drinks  ?  Comment: wine cooler-ocassional  ? Drug use: No  ? Sexual activity: Not Currently  ?  Birth control/protection: Surgical  ?Other Topics Concern  ? Not on file  ?Social History Narrative  ? Exercise: [employer], walking  ?   ? Moved  from [place]  ? ?Social Determinants of Health  ? ?Financial Resource Strain: Low Risk   ? Difficulty of Paying Living Expenses: Not hard at all  ?Food Insecurity: No Food Insecurity  ? Worried About Charity fundraiser in the Last Year: Never true  ? Ran Out of Food in the Last Year: Never true  ?Transportation Needs: No Transportation Needs  ? Lack of Transportation (Medical): No  ? Lack of Transportation (Non-Medical): No  ?Physical Activity: Sufficiently Active  ? Days of Exercise per Week: 3 days  ? Minutes of Exercise per Session: 60 min  ?Stress: No Stress Concern Present  ? Feeling of Stress : Not at all  ?Social Connections: Moderately Integrated  ? Frequency of Communication with Friends and Family: More than three times a week  ? Frequency of Social Gatherings with Friends and Family: More than three times a week  ? Attends Religious Services: More than 4 times per year  ? Active Member of Clubs or Organizations: Yes  ? Attends Archivist Meetings: More than 4 times per year  ? Marital Status: Never married  ? ? ?Tobacco Counseling ?Counseling given: Not Answered ? ? ?Clinical Intake: ? ?Pre-visit preparation completed: Yes ? ?Pain : No/denies pain ? ?  ? ?Nutritional Risks: None ?Diabetes: Yes ?CBG done?: No ?Did pt. bring in CBG monitor from home?: No ? ?How often do you need to have someone help you when you read instructions, pamphlets, or other written materials from your doctor or pharmacy?: 1 - Never ?What is the last grade level you completed in school?: Master's Degree ? ?Diabetic? yes ? ?Interpreter Needed?: No ? ?Information entered by :: Lisette Abu, LPN ? ? ?Activities of Daily Living ? ?  06/29/2021  ? 10:24 AM  ?In your present state of health, do you have any difficulty performing the following activities:  ?Hearing? 0  ?Vision? 0  ?Difficulty concentrating or making decisions? 0  ?Walking or climbing stairs? 0  ?Dressing or bathing? 0  ?Doing errands, shopping? 0  ?Preparing  Food and eating ? N  ?Using the Toilet? N  ?In the past six months, have you accidently leaked urine? N  ?Do you have problems with loss of bowel control? N  ?Managing your Medications? N  ?Managing your Finances? N  ?Housekeeping or managing your Housekeeping? N  ? ? ?Patient Care Team: ?Binnie Rail, MD as PCP - General (Internal Medicine) ?[facility] as Set designer (Optometry) ? ?Indicate any recent Medical Services you may have received from other than [facility] providers in the past year (date may be approximate). ? ?   ?Assessment:  ? This is a routine wellness examination for Veronica Carrillo. ? ?Hearing/Vision screen ?Hearing Screening - Comments:: Patient denied any hearing difficulty.   ?No  hearing aids. ? ?Vision Screening - Comments:: Patient does wear corrective lenses/contacts.   ?Eye exam done by: [facility] ? ?Dietary issues and exercise activities discussed: ?Current Exercise Habits: Home exercise routine, Type of exercise: Other - see comments (aqua therapy 3 times a week for 1 hour), Time (Minutes): 60, Frequency (Times/Week): 3, Weekly Exercise (Minutes/Week): 180, Intensity: Moderate, Exercise limited by: None identified ? ? Goals Addressed   ? ?  ?  ?  ?  ?  ? This Visit's Progress  ?   Client understands the importance of follow-up with providers by attending scheduled visits (pt-stated)     ?   My goal is to lose 30 pounds by changing my eating habits, increasing my water intake and staying away from sweets.  ?  ? ?  ?Depression Screen ? ?  06/29/2021  ? 10:21 AM 04/16/2020  ?  9:57 AM 10/29/2018  ? 10:23 AM 01/30/2018  ?  8:58 AM 09/25/2016  ?  2:21 PM 10/01/2015  ?  8:10 AM  ?PHQ 2/9 Scores  ?PHQ - 2 Score 0 0 2 0 0 0  ?PHQ- 9 Score   4 0 0   ?  ?Fall Risk ? ?  06/29/2021  ? 10:24 AM 04/29/2021  ?  9:01 AM 04/16/2020  ?  9:55 AM 03/15/2017  ?  6:36 PM 10/01/2015  ?  8:10 AM  ?Fall Risk   ?Falls in the past year? 0 0 0 No No  ?Comment    Emmi Telephone Survey: data to providers prior  to load   ?Number falls in past yr: 0 0 0    ?Injury with Fall? 0 0 1    ?Risk for fall due to : No Fall Risks No Fall Risks No Fall Risks    ?Follow up Falls evaluation completed Falls evaluation complete

## 2021-06-29 NOTE — Therapy (Addendum)
OUTPATIENT PHYSICAL THERAPY FEMALE PELVIC EVALUATION   Patient Name: Veronica Carrillo MRN: [id number] DOB:07-13-1945, 76 y.o., female Today's Date: 07/01/2021   PT End of Session - 07/01/21 1235     Visit Number 1    Date for PT Re-Evaluation 09/22/21    Authorization Type medicare part A/B    PT Start Time 1624    PT Stop Time 1700    PT Time Calculation (min) 36 min    Activity Tolerance Patient tolerated treatment well    Behavior During Therapy WFL for tasks assessed/performed             Past Medical History:  Diagnosis Date   Anxiety    Arthritis    Depression    Diabetes mellitus without complication ([place])    Diverticulitis    History of colon polyps    Hyperlipidemia    IBS (irritable bowel syndrome)    Past Surgical History:  Procedure Laterality Date   ABDOMINAL EXPLORATION SURGERY     ABDOMINAL HYSTERECTOMY     BREAST BIOPSY Left    COLONOSCOPY     DILATION AND CURETTAGE OF UTERUS     x 3   POLYPECTOMY     TONSILLECTOMY     Patient Active Problem List   Diagnosis Date Noted   Chronic kidney disease (CKD), stage IV (severe) ([place]) 08/19/2020   Hypertension 04/16/2020   Morbid obesity ([place]) 04/16/2020   Mixed stress and urge urinary incontinence 01/30/2018   Irritable bowel syndrome (IBS) 03/27/2017   Acute pain of right knee 11/28/2016   Hyperlipidemia 10/01/2015   Diverticulitis of colon 10/01/2015   Diabetes ([place]) 10/01/2015   Depression 10/01/2015   Anxiety 10/01/2015   Arthritis of right knee 10/01/2015    PCP: Binnie Rail, MD  REFERRING PROVIDER: Jaquita Folds, *  REFERRING DIAG:  V40.08 (ICD-10-CM) - Overactive bladder  R15.9 (ICD-10-CM) - Incontinence of feces, unspecified fecal incontinence type    THERAPY DIAG:  Muscle weakness (generalized) - Plan: PT plan of care cert/re-cert  Unspecified lack of coordination - Plan: PT plan of care cert/re-cert  ONSET DATE: 3-4 years  SUBJECTIVE:                                                                                                                                                                                            SUBJECTIVE STATEMENT: If I stop taking imodium my BM are very loose, but I still have a pellet come out when I cough, sneeze, lift something heavy.  It has gradually been getting worse. Fluid intake: 4 bottles of water is the goal but I  cannot because I am in the bathroom all the time  Patient confirms identification and approves PT to assess pelvic floor and treatment Yes   PAIN:  Are you having pain? No  PRECAUTIONS: None  WEIGHT BEARING RESTRICTIONS No  FALLS:  Has patient fallen in last 6 months? No, Number of falls: 0  LIVING ENVIRONMENT: Lives with: lives alone   OCCUPATION: full time; appointment clerk  PLOF: Independent  PATIENT GOALS have better control of bowel and bladder  PERTINENT HISTORY:  partial hysterectomy, tubaligation Sexual abuse: No  BOWEL MOVEMENT Pain with bowel movement: No Type of bowel movement:Type (Bristol Stool Scale) small hard to normal, Frequency every day to every other day, and Strain No but takes a long time Fully empty rectum: No rarely Leakage: Yes:   Pads:  Fiber supplement: imodium, fruits and veg  URINATION Pain with urination: No Fully empty bladder: Yes:   Stream: Strong Urgency: Yes: cannot hold it long Frequency: often Leakage: Urge to void and Walking to the bathroom Pads: Yes: 3/day  INTERCOURSE Not mentioned of having a partner  PREGNANCY Vaginal deliveries 1 Tearing No   PROLAPSE None    OBJECTIVE:   COGNITION:  Overall cognitive status: Within functional limits for tasks assessed     SENSATION:  Light touch: Appears intact  Proprioception: Appears intact  MUSCLE LENGTH: Hamstrings: 80% bil    GAIT:  Comments: decreased step length  POSTURE:  Rounded shoulder and anterior pelvic tilt   LE ROM:  Passive ROM Right 07/01/2021  Left 07/01/2021  Hip flexion 75% 75%  Hip extension    Hip abduction    Hip adduction    Hip internal rotation    Hip external rotation 75% WFL  Knee flexion    Knee extension    Ankle dorsiflexion    Ankle plantarflexion    Ankle inversion    Ankle eversion     (Blank rows = not tested)  LE MMT: Bil hip 4/5  PELVIC MMT:   Assessed rectally; sphincters 1/5; puborectalis 2/5 after several seconds        PALPATION:   General -  abdominal wall weak, thoracic spine and lubmar spine erectors tight                External Perineal Exam - descended                             Internal Pelvic Floor-  decreased tone and cocontraction of muscle with attempt to engage; normal relax with bulge but weak core and decreased pressure; bumpy ridge around anal sphincters unsure if skin or if it is feces  TONE: low  PROLAPSE: no  TODAY'S TREATMENT  EVAL toileting techniques   PATIENT EDUCATION:  Education details: BM handout Person educated: Patient Education method: Demonstration Education comprehension: verbalized understanding   HOME EXERCISE PROGRAM: BM  ASSESSMENT:  CLINICAL IMPRESSION: Patient is a 76 y.o. female who was seen today for physical therapy evaluation and treatment for fecal incontinence. Pt has posture as noted above.  Pt has general hip weakness and abdominal weakness as noted with strength test and palpation . Pt has decreased sensation of anal sphincter muscles and very weak muscle contraction.  Pt also has difficulty coordinating muscles.  Pt will benefit from skilled PT to address all above mentioned impairments    OBJECTIVE IMPAIRMENTS decreased activity tolerance, decreased coordination, decreased endurance, decreased ROM, decreased strength, and obesity.   ACTIVITY  LIMITATIONS community activity and toileting and continence .   PERSONAL FACTORS 3+ comorbidities: diverticulitis; diabetes, arthritis, depression/anxiety  are also affecting patient's  functional outcome.    REHAB POTENTIAL: Excellent  CLINICAL DECISION MAKING: Evolving/moderate complexity  EVALUATION COMPLEXITY: Moderate   GOALS: Goals reviewed with patient? Yes  SHORT TERM GOALS: Target date: 07/28/21  Ind with initial HEP Baseline:  Goal status: INITIAL  2.  Ind with toileting techniques Baseline:  Goal status: INITIAL  LONG TERM GOALS: Target date: 09/22/21  Pt will be able to functional actions such as coughing and sneezing without leakage  Baseline:  Goal status: INITIAL  2.  Pt will be independent with advanced HEP to maintain improvements made throughout therapy  Baseline:  Goal status: INITIAL  3.  Pt will have 50% less urgency due to bladder retraining and strengthening  Baseline:  Goal status: INITIAL  4.  Pt will report her BMs are complete due to improved bowel habits and evacuation techniques.  Baseline:  Goal status: INITIAL    PLAN: PT FREQUENCY: 1x/week  PT DURATION: 12 weeks  PLANNED INTERVENTIONS: Therapeutic exercises, Therapeutic activity, Neuromuscular re-education, Balance training, Gait training, Patient/Family education, Joint mobilization, Electrical stimulation, Cryotherapy, Moist heat, Taping, Biofeedback, and Manual therapy  PLAN FOR NEXT SESSION: needs to learn urge techniques, core strength, kegel with breathing; discuss stim for rectal sphincter in 1-2 visits after re-assessment   Jule Ser, PT 07/01/2021, 1:01 PM  PHYSICAL THERAPY DISCHARGE SUMMARY  Visits from Start of Care: 1  Current functional level related to goals / functional outcomes:   See above goals, eval only Remaining deficits: See above   Education / Equipment:  HEP Patient agrees to discharge. Patient goals were not met. Patient is being discharged due to not returning since the last visit.  Gustavus Bryant, PT 12/07/21 10:21 AM

## 2021-06-29 NOTE — Patient Instructions (Signed)
Veronica Carrillo , ?Thank you for taking time to come for your Medicare Wellness Visit. I appreciate your ongoing commitment to your health goals. Please review the following plan we discussed and let me know if I can assist you in the future.  ? ?Screening recommendations/referrals: ?Colonoscopy: Discontinued due to age ?Mammogram: Scheduled for 06/29/2021 at 1:40pm  ?Bone Density: 12/19/2016; due every 5 years ?Recommended yearly ophthalmology/optometry visit for glaucoma screening and checkup ?Recommended yearly dental visit for hygiene and checkup ? ?Vaccinations: ?Influenza vaccine: 01/12/2021 ?Pneumococcal vaccine: 09/25/2016, 10/29/2018 ?Tdap vaccine: 10/22//2019; due every 10 years ?Shingles vaccine: 04/21/2018, 06/20/2018   ?Covid-19: 06/06/2019, 07/01/2019, 01/24/2020 ? ?Advanced directives: Advance directive discussed with you today. Even though you declined this today please call our office should you change your mind and we can give you the proper paperwork for you to fill out. ? ?Conditions/risks identified: Yes; Client understands the importance of follow-up appointments with providers by attending scheduled visits and discussed goals to eat healthier, increase physical activity 5 times a week for 30 minutes each, exercise the brain by doing stimulating brain exercises (reading, adult coloring, crafting, listening to music, puzzles, etc.), socialize and enjoy life more, get enough sleep at least 8-9 hours average per night and make time for laughter. ? ? ?Next appointment: Please schedule your next Medicare Wellness Visit with your Nurse Health Advisor in 1 year or 366 days by calling [phone]. ? ? ?Preventive Care 49 Years and Older, Female ?Preventive care refers to lifestyle choices and visits with your health care provider that can promote health and wellness. ?What does preventive care include? ?A yearly physical exam. This is also called an annual well check. ?Dental exams once or twice a year. ?Routine eye  exams. Ask your health care provider how often you should have your eyes checked. ?Personal lifestyle choices, including: ?Daily care of your teeth and gums. ?Regular physical activity. ?Eating a healthy diet. ?Avoiding tobacco and drug use. ?Limiting alcohol use. ?Practicing safe sex. ?Taking low-dose aspirin every day. ?Taking vitamin and mineral supplements as recommended by your health care provider. ?What happens during an annual well check? ?The services and screenings done by your health care provider during your annual well check will depend on your age, overall health, lifestyle risk factors, and family history of disease. ?Counseling  ?Your health care provider may ask you questions about your: ?Alcohol use. ?Tobacco use. ?Drug use. ?Emotional well-being. ?Home and relationship well-being. ?Sexual activity. ?Eating habits. ?History of falls. ?Memory and ability to understand (cognition). ?Work and work Statistician. ?Reproductive health. ?Screening  ?You may have the following tests or measurements: ?Height, weight, and BMI. ?Blood pressure. ?Lipid and cholesterol levels. These may be checked every 5 years, or more frequently if you are over 47 years old. ?Skin check. ?Lung cancer screening. You may have this screening every year starting at age 78 if you have a 30-pack-year history of smoking and currently smoke or have quit within the past 15 years. ?Fecal occult blood test (FOBT) of the stool. You may have this test every year starting at age 24. ?Flexible sigmoidoscopy or colonoscopy. You may have a sigmoidoscopy every 5 years or a colonoscopy every 10 years starting at age 45. ?Hepatitis C blood test. ?Hepatitis B blood test. ?Sexually transmitted disease (STD) testing. ?Diabetes screening. This is done by checking your blood sugar (glucose) after you have not eaten for a while (fasting). You may have this done every 1-3 years. ?Bone density scan. This is done to screen for  osteoporosis. You may have  this done starting at age 13. ?Mammogram. This may be done every 1-2 years. Talk to your health care provider about how often you should have regular mammograms. ?Talk with your health care provider about your test results, treatment options, and if necessary, the need for more tests. ?Vaccines  ?Your health care provider may recommend certain vaccines, such as: ?Influenza vaccine. This is recommended every year. ?Tetanus, diphtheria, and acellular pertussis (Tdap, Td) vaccine. You may need a Td booster every 10 years. ?Zoster vaccine. You may need this after age 101. ?Pneumococcal 13-valent conjugate (PCV13) vaccine. One dose is recommended after age 61. ?Pneumococcal polysaccharide (PPSV23) vaccine. One dose is recommended after age 74. ?Talk to your health care provider about which screenings and vaccines you need and how often you need them. ?This information is not intended to replace advice given to you by your health care provider. Make sure you discuss any questions you have with your health care provider. ?Document Released: 04/23/2015 Document Revised: 12/15/2015 Document Reviewed: 01/26/2015 ?Elsevier Interactive Patient Education ? 2017 [place]. ? ?Fall Prevention in the Home ?Falls can cause injuries. They can happen to people of all ages. There are many things you can do to make your home safe and to help prevent falls. ?What can I do on the outside of my home? ?Regularly fix the edges of walkways and driveways and fix any cracks. ?Remove anything that might make you trip as you walk through a door, such as a raised step or threshold. ?Trim any bushes or trees on the path to your home. ?Use bright outdoor lighting. ?Clear any walking paths of anything that might make someone trip, such as rocks or tools. ?Regularly check to see if handrails are loose or broken. Make sure that both sides of any steps have handrails. ?Any raised decks and porches should have guardrails on the edges. ?Have any leaves,  snow, or ice cleared regularly. ?Use sand or salt on walking paths during winter. ?Clean up any spills in your garage right away. This includes oil or grease spills. ?What can I do in the bathroom? ?Use night lights. ?Install grab bars by the toilet and in the tub and shower. Do not use towel bars as grab bars. ?Use non-skid mats or decals in the tub or shower. ?If you need to sit down in the shower, use a plastic, non-slip stool. ?Keep the floor dry. Clean up any water that spills on the floor as soon as it happens. ?Remove soap buildup in the tub or shower regularly. ?Attach bath mats securely with double-sided non-slip rug tape. ?Do not have throw rugs and other things on the floor that can make you trip. ?What can I do in the bedroom? ?Use night lights. ?Make sure that you have a light by your bed that is easy to reach. ?Do not use any sheets or blankets that are too big for your bed. They should not hang down onto the floor. ?Have a firm chair that has side arms. You can use this for support while you get dressed. ?Do not have throw rugs and other things on the floor that can make you trip. ?What can I do in the kitchen? ?Clean up any spills right away. ?Avoid walking on wet floors. ?Keep items that you use a lot in easy-to-reach places. ?If you need to reach something above you, use a strong step stool that has a grab bar. ?Keep electrical cords out of the way. ?Do not  use floor polish or wax that makes floors slippery. If you must use wax, use non-skid floor wax. ?Do not have throw rugs and other things on the floor that can make you trip. ?What can I do with my stairs? ?Do not leave any items on the stairs. ?Make sure that there are handrails on both sides of the stairs and use them. Fix handrails that are broken or loose. Make sure that handrails are as long as the stairways. ?Check any carpeting to make sure that it is firmly attached to the stairs. Fix any carpet that is loose or worn. ?Avoid having throw  rugs at the top or bottom of the stairs. If you do have throw rugs, attach them to the floor with carpet tape. ?Make sure that you have a light switch at the top of the stairs and the bottom of the s

## 2021-06-30 ENCOUNTER — Other Ambulatory Visit: Payer: Self-pay

## 2021-06-30 ENCOUNTER — Ambulatory Visit: Payer: Managed Care, Other (non HMO) | Attending: Obstetrics and Gynecology | Admitting: Physical Therapy

## 2021-06-30 DIAGNOSIS — R279 Unspecified lack of coordination: Secondary | ICD-10-CM | POA: Insufficient documentation

## 2021-06-30 DIAGNOSIS — M6281 Muscle weakness (generalized): Secondary | ICD-10-CM | POA: Insufficient documentation

## 2021-07-01 ENCOUNTER — Encounter: Payer: Self-pay | Admitting: Physical Therapy

## 2021-07-17 ENCOUNTER — Encounter: Payer: Self-pay | Admitting: Internal Medicine

## 2021-07-17 NOTE — Progress Notes (Signed)
? ? ? ? ?Subjective:  ? ? Patient ID: Veronica Carrillo, female    DOB: [birth year], 76 y.o.   MRN: [id number] ? ?This visit occurred during the SARS-CoV-2 public health emergency.  Safety protocols were in place, including screening questions prior to the visit, additional usage of staff PPE, and extensive cleaning of exam room while observing appropriate contact time as indicated for disinfecting solutions.   ? ? ?HPI ?Veronica Carrillo is here for follow up of her chronic medical problems, including DM ? ?Started mounjaro one month ago.  No concerning side effects.  She has lost some weight.  She does feel maybe more constipated with the medication.  She does feel less hungry and is eating less. ? ?Anxiety from family -improved some.  She is not seeing a therapist ? ?She ran out of her losartan and has not been taking it.  It is on the way from the mail away pharmacy. ? ?Medications and allergies reviewed with patient and updated if appropriate. ? ?Current Outpatient Medications on File Prior to Visit  ?Medication Sig Dispense Refill  ? FARXIGA 10 MG TABS tablet TAKE 1 TABLET BY MOUTH  DAILY 90 tablet 3  ? loperamide (IMODIUM) 2 MG capsule Take 1 capsule (2 mg total) by mouth daily. 90 capsule 1  ? losartan (COZAAR) 25 MG tablet TAKE 1 TABLET BY MOUTH  DAILY 90 tablet 3  ? rosuvastatin (CRESTOR) 40 MG tablet TAKE 1 TABLET BY MOUTH  DAILY 90 tablet 3  ? tirzepatide [facility]) 2.5 MG/0.5ML Pen Inject 2.5 mg into the skin once a week. 2 mL 0  ? venlafaxine XR (EFFEXOR XR) 150 MG 24 hr capsule Take 1 capsule (150 mg total) by mouth daily with breakfast. 90 capsule 1  ? ?No current facility-administered medications on file prior to visit.  ? ? ? ?Review of Systems  ?Constitutional:  Negative for fever.  ?Respiratory:  Negative for cough, shortness of breath and wheezing.   ?Cardiovascular:  Negative for chest pain, palpitations and leg swelling.  ?Gastrointestinal:  Positive for constipation. Negative for nausea.   ?Neurological:  Negative for light-headedness and headaches.  ? ?   ?Objective:  ? ?Vitals:  ? 07/18/21 1442  ?BP: (!) 144/85  ?Pulse: 85  ?Temp: 98.2 ?F (36.8 ?C)  ?SpO2: 95%  ? ?BP Readings from Last 3 Encounters:  ?07/18/21 (!) 144/85  ?06/24/21 131/80  ?06/20/21 130/74  ? ?Wt Readings from Last 3 Encounters:  ?07/18/21 212 lb 6 oz (96.3 kg)  ?06/24/21 200 lb (90.7 kg)  ?06/20/21 222 lb (100.7 kg)  ? ?Body mass index is 37.62 kg/m?. ? ?  ?Physical Exam ?Constitutional:   ?   General: She is not in acute distress. ?   Appearance: Normal appearance.  ?HENT:  ?   Head: Normocephalic and atraumatic.  ?Eyes:  ?   Conjunctiva/sclera: Conjunctivae normal.  ?Cardiovascular:  ?   Rate and Rhythm: Normal rate and regular rhythm.  ?   Heart sounds: Normal heart sounds. No murmur heard. ?Pulmonary:  ?   Effort: Pulmonary effort is normal. No respiratory distress.  ?   Breath sounds: Normal breath sounds. No wheezing.  ?Musculoskeletal:  ?   Cervical back: Neck supple.  ?   Right lower leg: No edema.  ?   Left lower leg: No edema.  ?Lymphadenopathy:  ?   Cervical: No cervical adenopathy.  ?Skin: ?   Findings: No rash.  ?Neurological:  ?   Mental Status: She is alert. Mental status  is at baseline.  ?Psychiatric:     ?   Mood and Affect: Mood normal.     ?   Behavior: Behavior normal.  ? ?   ? ?Lab Results  ?Component Value Date  ? WBC 5.6 08/31/2020  ? HGB 12.9 08/31/2020  ? HCT 37.6 08/31/2020  ? PLT 272.0 08/31/2020  ? GLUCOSE 280 (H) 04/29/2021  ? CHOL 176 04/29/2021  ? TRIG 161.0 (H) 04/29/2021  ? HDL 54.70 04/29/2021  ? LDLDIRECT 90.0 08/31/2020  ? [place] 90 04/29/2021  ? ALT 17 04/29/2021  ? AST 20 04/29/2021  ? NA 139 04/29/2021  ? K 4.2 04/29/2021  ? CL 105 04/29/2021  ? CREATININE 1.67 (H) 04/29/2021  ? BUN 19 04/29/2021  ? CO2 28 04/29/2021  ? TSH 2.23 10/01/2015  ? HGBA1C 7.4 (H) 04/29/2021  ? MICROALBUR 31.4 (H) 04/16/2020  ? ? ? ?Assessment & Plan:  ? ? ?See Problem List for Assessment and Plan of chronic  medical problems.  ? ? ?

## 2021-07-17 NOTE — Patient Instructions (Addendum)
? ? ? ? ? ?  Medications changes include :  increase mounjaro to 5 mg weekly  ? ? ? ?Your prescription(s) have been sent to your pharmacy.  ? ? ? ?Return for follow up as scheduled. ? ?

## 2021-07-18 ENCOUNTER — Ambulatory Visit (INDEPENDENT_AMBULATORY_CARE_PROVIDER_SITE_OTHER): Payer: Managed Care, Other (non HMO) | Admitting: Internal Medicine

## 2021-07-18 DIAGNOSIS — I10 Essential (primary) hypertension: Secondary | ICD-10-CM

## 2021-07-18 DIAGNOSIS — E1165 Type 2 diabetes mellitus with hyperglycemia: Secondary | ICD-10-CM

## 2021-07-18 DIAGNOSIS — N184 Chronic kidney disease, stage 4 (severe): Secondary | ICD-10-CM

## 2021-07-18 MED ORDER — TIRZEPATIDE 5 MG/0.5ML ~~LOC~~ SOAJ
5.0000 mg | SUBCUTANEOUS | 0 refills | Status: DC
Start: 2021-07-18 — End: 2021-08-18

## 2021-07-18 MED ORDER — LOSARTAN POTASSIUM 25 MG PO TABS: 25.0000 mg | ORAL_TABLET | Freq: Every day | ORAL | 0 refills | Status: DC

## 2021-07-18 NOTE — Assessment & Plan Note (Signed)
Chronic ?Not controlled here today-ran out of her medication ?7-day supply of losartan 25 mg daily sent to local pharmacy ?She is expecting a 90-day supply from her mail order pharmacy-if she does not get that within the next 7 days she will let me know so I can send another prescription ?Stressed good blood pressure control to protect her kidneys ?

## 2021-07-18 NOTE — Assessment & Plan Note (Signed)
Chronic ?Not ideally controlled ?Started on Mounjaro 1 month ago 2.5 mg weekly and doing well-possibly slight constipation, but no other concerning side effects ?She has lost weight and we will continue the medication ?She is exercising and will continue ?Increase Mounjaro to 5 mg weekly ?Continue Farxiga 10 mg daily ?Will check blood work in July at her next visit ?

## 2021-07-18 NOTE — Assessment & Plan Note (Signed)
Chronic ?Following with nephrology ?Stressed no NSAIDs ?She is drinking plenty of water ?Discussed the importance of good blood pressure and sugar control ?She is losing weight with Mounjaro-discussed that we will also help with her kidney function ?Recheck kidney function in July ?

## 2021-07-19 ENCOUNTER — Ambulatory Visit: Payer: Managed Care, Other (non HMO) | Admitting: Physical Therapy

## 2021-08-04 ENCOUNTER — Encounter: Payer: Managed Care, Other (non HMO) | Admitting: Physical Therapy

## 2021-08-08 ENCOUNTER — Encounter: Payer: Managed Care, Other (non HMO) | Admitting: Physical Therapy

## 2021-08-11 ENCOUNTER — Ambulatory Visit: Payer: Managed Care, Other (non HMO) | Admitting: Physical Therapy

## 2021-08-15 ENCOUNTER — Encounter: Payer: Managed Care, Other (non HMO) | Admitting: Physical Therapy

## 2021-08-18 ENCOUNTER — Telehealth: Payer: Self-pay

## 2021-08-18 MED ORDER — TIRZEPATIDE 5 MG/0.5ML ~~LOC~~ SOAJ: 5.0000 mg | SUBCUTANEOUS | 0 refills | Status: DC

## 2021-08-18 NOTE — Telephone Encounter (Signed)
Pt is requesting refill for: ?tirzepatide [facility]) 5 MG/0.5ML Pen ? ?Pharmacy: ?[id number] - [place], [place] - [address] AT [place] ? ?LOV 07/18/21 ?ROV 10/28/21 ?

## 2021-08-18 NOTE — Telephone Encounter (Signed)
Sent refill to [facility].. Pt is due in July..lmb ?

## 2021-08-19 ENCOUNTER — Other Ambulatory Visit: Payer: Self-pay | Admitting: Internal Medicine

## 2021-08-22 ENCOUNTER — Encounter: Payer: Managed Care, Other (non HMO) | Admitting: Physical Therapy

## 2021-08-23 ENCOUNTER — Encounter: Payer: Managed Care, Other (non HMO) | Admitting: Physical Therapy

## 2021-08-29 ENCOUNTER — Encounter: Payer: Managed Care, Other (non HMO) | Admitting: Physical Therapy

## 2021-08-30 ENCOUNTER — Encounter: Payer: Managed Care, Other (non HMO) | Admitting: Physical Therapy

## 2021-09-12 NOTE — Therapy (Deleted)
OUTPATIENT PHYSICAL THERAPY TREATMENT NOTE   Patient Name: Veronica Carrillo MRN: [id number] DOB:[birth year], 76 y.o., female Today's Date: 09/12/2021  PCP: Binnie Rail, MD REFERRING PROVIDER: Jaquita Folds, MD  END OF SESSION:    Past Medical History:  Diagnosis Date   Anxiety    Arthritis    Depression    Diabetes mellitus without complication ([place])    Diverticulitis    History of colon polyps    Hyperlipidemia    IBS (irritable bowel syndrome)    Past Surgical History:  Procedure Laterality Date   ABDOMINAL EXPLORATION SURGERY     ABDOMINAL HYSTERECTOMY     BREAST BIOPSY Left    COLONOSCOPY     DILATION AND CURETTAGE OF UTERUS     x 3   POLYPECTOMY     TONSILLECTOMY     Patient Active Problem List   Diagnosis Date Noted   Chronic kidney disease (CKD), stage IV (severe) ([place]) 08/19/2020   Hypertension 04/16/2020   Morbid obesity ([place]) 04/16/2020   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  Rationale for Evaluation and Treatment Rehabilitation  PERTINENT HISTORY: partial hysterectomy, tubaligation  PRECAUTIONS: none  SUBJECTIVE: ***  PAIN:  Are you having pain? {[id number]}   OBJECTIVE: (objective measures completed at initial evaluation unless otherwise dated)  MUSCLE LENGTH: Hamstrings: 80% bil       GAIT:   Comments: decreased step length   POSTURE:  Rounded shoulder and anterior pelvic tilt     LE ROM:   Passive ROM Right 07/01/2021 Left 07/01/2021  Hip flexion 75%  75%  Hip extension      Hip abduction      Hip adduction      Hip internal rotation      Hip external rotation 75% WFL  Knee flexion      Knee extension      Ankle dorsiflexion      Ankle plantarflexion      Ankle inversion      Ankle eversion       (Blank rows = not tested)   LE MMT: Bil hip 4/5   PELVIC MMT:   Assessed rectally; sphincters 1/5; puborectalis 2/5 after several seconds         PALPATION:   General -  abdominal wall weak, thoracic spine and lubmar spine erectors tight                 External Perineal Exam - descended                             Internal Pelvic Floor-  decreased tone and cocontraction of muscle with attempt to engage; normal relax with bulge but weak core and decreased pressure; bumpy ridge around anal sphincters unsure if skin or if it is feces   TONE: low   PROLAPSE: no   TODAY'S TREATMENT  EVAL toileting techniques     PATIENT EDUCATION:  Education details: BM handout Person educated: Patient Education method: Demonstration Education comprehension:  verbalized understanding     HOME EXERCISE PROGRAM: BM   ASSESSMENT:   CLINICAL IMPRESSION: Patient is a 76 y.o. female who was seen today for physical therapy evaluation and treatment for fecal incontinence. Pt has posture as noted above.  Pt has general hip weakness and abdominal weakness as noted with strength test and palpation . Pt has decreased sensation of anal sphincter muscles and very weak muscle contraction.  Pt also has difficulty coordinating muscles.  Pt will benefit from skilled PT to address all above mentioned impairments       OBJECTIVE IMPAIRMENTS decreased activity tolerance, decreased coordination, decreased endurance, decreased ROM, decreased strength, and obesity.    ACTIVITY LIMITATIONS community activity and toileting and continence .    PERSONAL FACTORS 3+ comorbidities: diverticulitis; diabetes, arthritis, depression/anxiety  are also affecting  patient's functional outcome.      REHAB POTENTIAL: Excellent   CLINICAL DECISION MAKING: Evolving/moderate complexity   EVALUATION COMPLEXITY: Moderate     GOALS: Goals reviewed with patient? Yes   SHORT TERM GOALS: Target date: 07/28/21   Ind with initial HEP Baseline:  Goal status: INITIAL   2.  Ind with toileting techniques Baseline:  Goal status: INITIAL   LONG TERM GOALS: Target date: 09/22/21   Pt will be able to functional actions such as coughing and sneezing without leakage  Baseline:  Goal status: INITIAL   2.  Pt will be independent with advanced HEP to maintain improvements made throughout therapy   Baseline:  Goal status: INITIAL   3.  Pt will have 50% less urgency due to bladder retraining and strengthening   Baseline:  Goal status: INITIAL   4.  Pt will report her BMs are complete due to improved bowel habits and evacuation techniques.  Baseline:  Goal status: INITIAL       PLAN: PT FREQUENCY: 1x/week   PT DURATION: 12 weeks   PLANNED INTERVENTIONS: Therapeutic exercises, Therapeutic activity, Neuromuscular re-education, Balance training, Gait training, Patient/Family education, Joint mobilization, Electrical stimulation, Cryotherapy, Moist heat, Taping, Biofeedback, and Manual therapy   PLAN FOR NEXT SESSION: needs to learn urge techniques, core strength, kegel with breathing; discuss stim for rectal sphincter in 1-2 visits after re-assessment    Jule Ser, PT 09/12/2021, 5:58 PM

## 2021-09-13 ENCOUNTER — Encounter: Payer: Managed Care, Other (non HMO) | Admitting: Physical Therapy

## 2021-09-22 ENCOUNTER — Encounter: Payer: Managed Care, Other (non HMO) | Admitting: Physical Therapy

## 2021-09-23 ENCOUNTER — Ambulatory Visit: Payer: Medicare Other | Admitting: Internal Medicine

## 2021-09-26 NOTE — Therapy (Deleted)
OUTPATIENT PHYSICAL THERAPY TREATMENT NOTE   Patient Name: Veronica Carrillo MRN: [id number] DOB:April 10, 1946, 76 y.o., female Today's Date: 09/26/2021  PCP: Binnie Rail, MD REFERRING PROVIDER: Jaquita Folds, MD  END OF SESSION:    Past Medical History:  Diagnosis Date   Anxiety    Arthritis    Depression    Diabetes mellitus without complication ([place])    Diverticulitis    History of colon polyps    Hyperlipidemia    IBS (irritable bowel syndrome)    Past Surgical History:  Procedure Laterality Date   ABDOMINAL EXPLORATION SURGERY     ABDOMINAL HYSTERECTOMY     BREAST BIOPSY Left    COLONOSCOPY     DILATION AND CURETTAGE OF UTERUS     x 3   POLYPECTOMY     TONSILLECTOMY     Patient Active Problem List   Diagnosis Date Noted   Chronic kidney disease (CKD), stage IV (severe) ([place]) 08/19/2020   Hypertension 04/16/2020   Morbid obesity ([place]) 04/16/2020   Mixed stress and urge urinary incontinence 01/30/2018   Irritable bowel syndrome (IBS) 03/27/2017   Acute pain of right knee 11/28/2016   Hyperlipidemia 10/01/2015   Diverticulitis of colon 10/01/2015   Diabetes ([place]) 10/01/2015   Depression 10/01/2015   Anxiety 10/01/2015   Arthritis of right knee 10/01/2015    REFERRING DIAG:  N32.81 (ICD-10-CM) - Overactive bladder  R15.9 (ICD-10-CM) - Incontinence of feces, unspecified fecal incontinence type    THERAPY DIAG:  No diagnosis found.  Rationale for Evaluation and Treatment Rehabilitation  PERTINENT HISTORY: partial hysterectomy, tubaligation  PRECAUTIONS: none  SUBJECTIVE: ***  PAIN:  Are you having pain? {[id number]}   OBJECTIVE: (objective measures completed at initial evaluation unless otherwise dated)  MUSCLE LENGTH: Hamstrings: 80% bil       GAIT:   Comments: decreased step length   POSTURE:  Rounded shoulder and anterior pelvic tilt     LE ROM:   Passive ROM Right 07/01/2021 Left 07/01/2021  Hip flexion 75%  75%  Hip extension      Hip abduction      Hip adduction      Hip internal rotation      Hip external rotation 75% WFL  Knee flexion      Knee extension      Ankle dorsiflexion      Ankle plantarflexion      Ankle inversion      Ankle eversion       (Blank rows = not tested)   LE MMT: Bil hip 4/5   PELVIC MMT:   Assessed rectally; sphincters 1/5; puborectalis 2/5 after several seconds         PALPATION:   General -  abdominal wall weak, thoracic spine and lubmar spine erectors tight                 External Perineal Exam - descended                             Internal Pelvic Floor-  decreased tone and cocontraction of muscle with attempt to engage; normal relax with bulge but weak core and decreased pressure; bumpy ridge around anal sphincters unsure if skin or if it is feces   TONE: low   PROLAPSE: no   TODAY'S TREATMENT  EVAL toileting techniques     PATIENT EDUCATION:  Education details: BM handout Person educated: Patient Education method: Demonstration Education comprehension:  verbalized understanding     HOME EXERCISE PROGRAM: BM   ASSESSMENT:   CLINICAL IMPRESSION: Patient is a 76 y.o. female who was seen today for physical therapy evaluation and treatment for fecal incontinence. Pt has posture as noted above.  Pt has general hip weakness and abdominal weakness as noted with strength test and palpation . Pt has decreased sensation of anal sphincter muscles and very weak muscle contraction.  Pt also has difficulty coordinating muscles.  Pt will benefit from skilled PT to address all above mentioned impairments       OBJECTIVE IMPAIRMENTS decreased activity tolerance, decreased coordination, decreased endurance, decreased ROM, decreased strength, and obesity.    ACTIVITY LIMITATIONS community activity and toileting and continence .    PERSONAL FACTORS 3+ comorbidities: diverticulitis; diabetes, arthritis, depression/anxiety  are also affecting  patient's functional outcome.      REHAB POTENTIAL: Excellent   CLINICAL DECISION MAKING: Evolving/moderate complexity   EVALUATION COMPLEXITY: Moderate     GOALS: Goals reviewed with patient? Yes   SHORT TERM GOALS: Target date: 07/28/21   Ind with initial HEP Baseline:  Goal status: INITIAL   2.  Ind with toileting techniques Baseline:  Goal status: INITIAL   LONG TERM GOALS: Target date: 09/22/21   Pt will be able to functional actions such as coughing and sneezing without leakage  Baseline:  Goal status: INITIAL   2.  Pt will be independent with advanced HEP to maintain improvements made throughout therapy   Baseline:  Goal status: INITIAL   3.  Pt will have 50% less urgency due to bladder retraining and strengthening   Baseline:  Goal status: INITIAL   4.  Pt will report her BMs are complete due to improved bowel habits and evacuation techniques.  Baseline:  Goal status: INITIAL       PLAN: PT FREQUENCY: 1x/week   PT DURATION: 12 weeks   PLANNED INTERVENTIONS: Therapeutic exercises, Therapeutic activity, Neuromuscular re-education, Balance training, Gait training, Patient/Family education, Joint mobilization, Electrical stimulation, Cryotherapy, Moist heat, Taping, Biofeedback, and Manual therapy   PLAN FOR NEXT SESSION: needs to learn urge techniques, core strength, kegel with breathing; discuss stim for rectal sphincter in 1-2 visits after re-assessment    Jule Ser, PT 09/26/2021, 4:24 PM

## 2021-09-27 ENCOUNTER — Telehealth: Payer: Self-pay | Admitting: Physical Therapy

## 2021-09-27 ENCOUNTER — Ambulatory Visit: Payer: Managed Care, Other (non HMO) | Attending: Obstetrics and Gynecology | Admitting: Physical Therapy

## 2021-09-27 DIAGNOSIS — R279 Unspecified lack of coordination: Secondary | ICD-10-CM | POA: Insufficient documentation

## 2021-09-27 DIAGNOSIS — M6281 Muscle weakness (generalized): Secondary | ICD-10-CM | POA: Insufficient documentation

## 2021-09-27 NOTE — Telephone Encounter (Signed)
Pt called due to no show.  She is in [place] helping her sister in the hospital and reports she will call back tomorrow to confirm future appointments.  States that she will find out exactly when she will be able to start back at [facility], PT 09/27/21 4:36 PM

## 2021-09-29 ENCOUNTER — Encounter: Payer: Self-pay | Admitting: Internal Medicine

## 2021-09-29 NOTE — Progress Notes (Unsigned)
      Subjective:    Patient ID: Veronica Carrillo, female    DOB: 09/24/1945, 76 y.o.   MRN: [id number]     HPI Veronica Carrillo is here for follow up of her chronic medical problems, including DM, htn, CKD    Medications and allergies reviewed with patient and updated if appropriate.  Current Outpatient Medications on File Prior to Visit  Medication Sig Dispense Refill   FARXIGA 10 MG TABS tablet TAKE 1 TABLET BY MOUTH  DAILY 90 tablet 3   loperamide (IMODIUM) 2 MG capsule Take 1 capsule (2 mg total) by mouth daily. 90 capsule 1   losartan (COZAAR) 25 MG tablet Take 1 tablet (25 mg total) by mouth daily. 7 tablet 0   rosuvastatin (CRESTOR) 40 MG tablet TAKE 1 TABLET BY MOUTH  DAILY 90 tablet 3   tirzepatide (MOUNJARO) 5 MG/0.5ML Pen Inject 5 mg into the skin once a week. 2 mL 0   venlafaxine XR (EFFEXOR XR) 150 MG 24 hr capsule Take 1 capsule (150 mg total) by mouth daily with breakfast. 90 capsule 1   No current facility-administered medications on file prior to visit.     Review of Systems     Objective:  There were no vitals filed for this visit. BP Readings from Last 3 Encounters:  07/18/21 (!) 144/85  06/24/21 131/80  06/20/21 130/74   Wt Readings from Last 3 Encounters:  07/18/21 212 lb 6 oz (96.3 kg)  06/24/21 200 lb (90.7 kg)  06/20/21 222 lb (100.7 kg)   There is no height or weight on file to calculate BMI.    Physical Exam     Lab Results  Component Value Date   WBC 5.6 08/31/2020   HGB 12.9 08/31/2020   HCT 37.6 08/31/2020   PLT 272.0 08/31/2020   GLUCOSE 280 (H) 04/29/2021   CHOL 176 04/29/2021   TRIG 161.0 (H) 04/29/2021   HDL 54.70 04/29/2021   LDLDIRECT 90.0 08/31/2020   LDLCALC 90 04/29/2021   ALT 17 04/29/2021   AST 20 04/29/2021   NA 139 04/29/2021   K 4.2 04/29/2021   CL 105 04/29/2021   CREATININE 1.67 (H) 04/29/2021   BUN 19 04/29/2021   CO2 28 04/29/2021   TSH 2.23 10/01/2015   HGBA1C 7.4 (H) 04/29/2021   MICROALBUR 31.4  (H) 04/16/2020     Assessment & Plan:    See Problem List for Assessment and Plan of chronic medical problems.

## 2021-09-30 ENCOUNTER — Other Ambulatory Visit: Payer: Managed Care, Other (non HMO)

## 2021-09-30 ENCOUNTER — Ambulatory Visit (INDEPENDENT_AMBULATORY_CARE_PROVIDER_SITE_OTHER): Payer: Managed Care, Other (non HMO) | Admitting: Internal Medicine

## 2021-09-30 VITALS — BP 132/74 | HR 93 | Temp 98.1°F | Ht 63.0 in | Wt 202.5 lb

## 2021-09-30 DIAGNOSIS — E1165 Type 2 diabetes mellitus with hyperglycemia: Secondary | ICD-10-CM

## 2021-09-30 DIAGNOSIS — F3289 Other specified depressive episodes: Secondary | ICD-10-CM

## 2021-09-30 DIAGNOSIS — N184 Chronic kidney disease, stage 4 (severe): Secondary | ICD-10-CM | POA: Diagnosis not present

## 2021-09-30 DIAGNOSIS — F419 Anxiety disorder, unspecified: Secondary | ICD-10-CM

## 2021-09-30 DIAGNOSIS — I10 Essential (primary) hypertension: Secondary | ICD-10-CM | POA: Diagnosis not present

## 2021-09-30 LAB — COMPREHENSIVE METABOLIC PANEL
ALT: 15 U/L (ref 0–35)
AST: 22 U/L (ref 0–37)
Albumin: 4 g/dL (ref 3.5–5.2)
Alkaline Phosphatase: 90 U/L (ref 39–117)
BUN: 16 mg/dL (ref 6–23)
CO2: 28 mEq/L (ref 19–32)
Calcium: 9.4 mg/dL (ref 8.4–10.5)
Chloride: 106 mEq/L (ref 96–112)
Creatinine, Ser: 1.48 mg/dL — ABNORMAL HIGH (ref 0.40–1.20)
GFR: 34.52 mL/min — ABNORMAL LOW (ref >60.00)
Glucose, Bld: 98 mg/dL (ref 70–99)
Potassium: 4.1 mEq/L (ref 3.5–5.1)
Sodium: 142 mEq/L (ref 135–145)
Total Bilirubin: 1 mg/dL (ref 0.2–1.2)
Total Protein: 7 g/dL (ref 6.0–8.3)

## 2021-09-30 LAB — LIPID PANEL
Cholesterol: 137 mg/dL (ref 0–200)
HDL: 45.9 mg/dL (ref >39.00)
LDL Cholesterol: 69 mg/dL (ref 0–99)
NonHDL: 91.04
Total CHOL/HDL Ratio: 3
Triglycerides: 110 mg/dL (ref 0.0–149.0)
VLDL: 22 mg/dL (ref 0.0–40.0)

## 2021-09-30 LAB — MICROALBUMIN / CREATININE URINE RATIO
Creatinine,U: 124.7 mg/dL
Microalb Creat Ratio: 1.1 mg/g (ref 0.0–30.0)
Microalb, Ur: 1.4 mg/dL (ref 0.0–1.9)

## 2021-09-30 MED ORDER — ROSUVASTATIN CALCIUM 40 MG PO TABS: 40.0000 mg | ORAL_TABLET | Freq: Every day | ORAL | 3 refills | Status: DC

## 2021-09-30 MED ORDER — LOSARTAN POTASSIUM 25 MG PO TABS
25.0000 mg | ORAL_TABLET | Freq: Every day | ORAL | 2 refills | Status: DC
Start: 2021-09-30 — End: 2022-09-25

## 2021-09-30 MED ORDER — TIRZEPATIDE 7.5 MG/0.5ML ~~LOC~~ SOAJ: 7.5000 mg | SUBCUTANEOUS | 0 refills | Status: DC

## 2021-09-30 MED ORDER — TIRZEPATIDE 5 MG/0.5ML ~~LOC~~ SOAJ
5.0000 mg | SUBCUTANEOUS | 0 refills | Status: DC
Start: 2021-09-30 — End: 2021-09-30

## 2021-09-30 NOTE — Assessment & Plan Note (Signed)
Chronic Following with nephrology CMP 

## 2021-09-30 NOTE — Assessment & Plan Note (Signed)
Chronic Blood pressure well controlled CMP Continue losartan 25 mg daily 

## 2021-09-30 NOTE — Assessment & Plan Note (Signed)
Chronic Controlled, Stable Continue Effexor 150 mg daily 

## 2021-10-01 LAB — HEMOGLOBIN A1C
Hgb A1c MFr Bld: 5.5 % of total Hgb (ref <5.7)
Mean Plasma Glucose: 111 mg/dL
eAG (mmol/L): 6.2 mmol/L

## 2021-10-04 ENCOUNTER — Ambulatory Visit: Payer: Managed Care, Other (non HMO) | Admitting: Physical Therapy

## 2021-10-12 ENCOUNTER — Telehealth: Payer: Self-pay | Admitting: Internal Medicine

## 2021-10-12 ENCOUNTER — Encounter: Payer: Managed Care, Other (non HMO) | Admitting: Physical Therapy

## 2021-10-12 NOTE — Telephone Encounter (Signed)
Pt request tirzepatide [facility]) 7.5 MG/0.5ML Pen  to be changed to 5mg     LOV-09/30/21 ROV-01/30/22    Please Advise

## 2021-10-13 ENCOUNTER — Other Ambulatory Visit: Payer: Self-pay

## 2021-10-13 MED ORDER — TIRZEPATIDE 5 MG/0.5ML ~~LOC~~ SOAJ: 5.0000 mg | SUBCUTANEOUS | 0 refills | Status: DC

## 2021-10-13 NOTE — Telephone Encounter (Signed)
Sent in today to local pharmacy.  Called and left message for patient to let me know what pharmacy she wanted it sent to (incase she wanted it to go to mail order)

## 2021-10-13 NOTE — Telephone Encounter (Signed)
Pt called requested tirzepatide [facility]) 5 MG/0.5ML Pen be sent to  [id number] - [place], [place] Phone:  [phone]  Fax:  [phone]

## 2021-10-14 ENCOUNTER — Other Ambulatory Visit: Payer: Self-pay

## 2021-10-14 MED ORDER — TIRZEPATIDE 5 MG/0.5ML ~~LOC~~ SOAJ: 5.0000 mg | SUBCUTANEOUS | 0 refills | Status: DC

## 2021-10-14 NOTE — Telephone Encounter (Signed)
Script sent in to last pharmacy requested.

## 2021-10-26 ENCOUNTER — Other Ambulatory Visit: Payer: Self-pay | Admitting: Internal Medicine

## 2021-10-28 ENCOUNTER — Ambulatory Visit: Payer: Managed Care, Other (non HMO) | Admitting: Internal Medicine

## 2021-11-11 ENCOUNTER — Other Ambulatory Visit: Payer: Self-pay | Admitting: Internal Medicine

## 2021-11-11 ENCOUNTER — Other Ambulatory Visit: Payer: Self-pay

## 2021-11-11 ENCOUNTER — Telehealth: Payer: Self-pay | Admitting: Internal Medicine

## 2021-11-11 DIAGNOSIS — E1165 Type 2 diabetes mellitus with hyperglycemia: Secondary | ICD-10-CM

## 2021-11-11 MED ORDER — TIRZEPATIDE 7.5 MG/0.5ML ~~LOC~~ SOAJ: 7.5000 mg | SUBCUTANEOUS | 0 refills | Status: DC

## 2021-11-11 NOTE — Telephone Encounter (Signed)
Caller & Relationship to patient: Self  Call back number: [phone]  Date of last office visit: 09/30/21  Date of next office visit: 01/30/22  Medication(s) to be refilled: tirzepatide [facility]) 5 MG/0.5ML Pen   Preferred Pharmacy: [id number] - [place], [place]

## 2021-11-11 NOTE — Telephone Encounter (Signed)
Med has been sent to pharmacy.

## 2021-11-13 ENCOUNTER — Other Ambulatory Visit: Payer: Self-pay | Admitting: Internal Medicine

## 2021-12-10 ENCOUNTER — Other Ambulatory Visit: Payer: Self-pay | Admitting: Internal Medicine

## 2021-12-10 DIAGNOSIS — E1165 Type 2 diabetes mellitus with hyperglycemia: Secondary | ICD-10-CM

## 2021-12-20 ENCOUNTER — Telehealth: Payer: Self-pay | Admitting: Internal Medicine

## 2021-12-20 NOTE — Telephone Encounter (Signed)
Patient is calling to request her mounjaro - [facility] [address] - Patient would like to know if refills can be added so she doesn't have to call everytime.

## 2021-12-22 NOTE — Telephone Encounter (Signed)
Message left for patient today to return call to clinic.  If she calls back please ask her if she wants to stay on the 7.5 mg or move up to the 10 mg and let me know her response.

## 2021-12-22 NOTE — Telephone Encounter (Signed)
Patient wants to go to the 10 mg.

## 2021-12-23 ENCOUNTER — Other Ambulatory Visit: Payer: Self-pay

## 2021-12-23 MED ORDER — TIRZEPATIDE 10 MG/0.5ML ~~LOC~~ SOAJ: 10.0000 mg | SUBCUTANEOUS | 0 refills | Status: DC

## 2021-12-23 NOTE — Telephone Encounter (Signed)
Script faxed today 

## 2021-12-29 ENCOUNTER — Ambulatory Visit (INDEPENDENT_AMBULATORY_CARE_PROVIDER_SITE_OTHER): Payer: Medicare Other

## 2021-12-29 ENCOUNTER — Encounter: Payer: Self-pay | Admitting: Podiatry

## 2021-12-29 ENCOUNTER — Ambulatory Visit (INDEPENDENT_AMBULATORY_CARE_PROVIDER_SITE_OTHER): Payer: Managed Care, Other (non HMO) | Admitting: Podiatry

## 2021-12-29 DIAGNOSIS — M2041 Other hammer toe(s) (acquired), right foot: Secondary | ICD-10-CM | POA: Diagnosis not present

## 2021-12-29 DIAGNOSIS — L84 Corns and callosities: Secondary | ICD-10-CM

## 2022-01-01 NOTE — Progress Notes (Signed)
Subjective:   Patient ID: Veronica Carrillo, female   DOB: 76 y.o.   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  Patient will be seen back depending on symptoms may require more aggressive treatment plan  X-rays right indicated there is been it old arthroplasty down with mild bone regrowth which may be part of the pathology she is experiencing which I explained today

## 2022-01-23 ENCOUNTER — Ambulatory Visit: Payer: Managed Care, Other (non HMO) | Admitting: Internal Medicine

## 2022-01-23 ENCOUNTER — Other Ambulatory Visit: Payer: Self-pay | Admitting: Internal Medicine

## 2022-01-23 DIAGNOSIS — E1165 Type 2 diabetes mellitus with hyperglycemia: Secondary | ICD-10-CM

## 2022-01-29 ENCOUNTER — Encounter: Payer: Self-pay | Admitting: Internal Medicine

## 2022-01-29 NOTE — Patient Instructions (Addendum)
    Flu immunization administered today.    Blood work was ordered.   The lab is on the first floor.    Medications changes include :   increase mounjaro to 10 mg       Return in about 6 months (around 08/01/2022) for Physical Exam.

## 2022-01-29 NOTE — Progress Notes (Unsigned)
Subjective:    Patient ID: Veronica Carrillo, female    DOB: Dec 08, 1945, 76 y.o.   MRN: [id number]     HPI Veronica Carrillo is here for follow up of her chronic medical problems, including DM, htn, CKD, depression/anxiety   Doing water aerobics.    Medications and allergies reviewed with patient and updated if appropriate.  Current Outpatient Medications on File Prior to Visit  Medication Sig Dispense Refill   FARXIGA 10 MG TABS tablet TAKE 1 TABLET BY MOUTH  DAILY 90 tablet 3   losartan (COZAAR) 25 MG tablet Take 1 tablet (25 mg total) by mouth daily. 90 tablet 2   meloxicam (MOBIC) 15 MG tablet Take 15 mg by mouth daily.     MOUNJARO 7.5 MG/0.5ML Pen INJECT 7.5 MG SUBCUTANEOUSLY WEEKLY 2 mL 0   rosuvastatin (CRESTOR) 40 MG tablet Take 1 tablet (40 mg total) by mouth daily. 90 tablet 3   tirzepatide (MOUNJARO) 10 MG/0.5ML Pen Inject 10 mg into the skin once a week. 6 mL 0   tirzepatide (MOUNJARO) 5 MG/0.5ML Pen Inject 5 mg into the skin once a week. 2 mL 0   venlafaxine XR (EFFEXOR-XR) 150 MG 24 hr capsule TAKE 1 CAPSULE BY MOUTH DAILY  WITH BREAKFAST 90 capsule 3   loperamide (IMODIUM) 2 MG capsule Take 1 capsule (2 mg total) by mouth daily. (Patient not taking: Reported on 09/30/2021) 90 capsule 1   No current facility-administered medications on file prior to visit.     Review of Systems  Constitutional:  Negative for chills and fever.  Respiratory:  Negative for cough, shortness of breath and wheezing.   Cardiovascular:  Negative for chest pain, palpitations and leg swelling.  Gastrointestinal:  Negative for nausea.  Neurological:  Negative for light-headedness and headaches.       Objective:   Vitals:   01/30/22 0753  BP: 138/82  Pulse: 75  Temp: 98 F (36.7 C)  SpO2: 99%   BP Readings from Last 3 Encounters:  01/30/22 138/82  09/30/21 132/74  07/18/21 (!) 144/85   Wt Readings from Last 3 Encounters:  01/30/22 190 lb 3.2 oz (86.3 kg)  09/30/21 202  lb 8 oz (91.9 kg)  07/18/21 212 lb 6 oz (96.3 kg)   Body mass index is 33.69 kg/m.    Physical Exam Constitutional:      General: She is not in acute distress.    Appearance: Normal appearance.  HENT:     Head: Normocephalic and atraumatic.  Eyes:     Conjunctiva/sclera: Conjunctivae normal.  Cardiovascular:     Rate and Rhythm: Normal rate and regular rhythm.     Heart sounds: Normal heart sounds. No murmur heard. Pulmonary:     Effort: Pulmonary effort is normal. No respiratory distress.     Breath sounds: Normal breath sounds. No wheezing.  Musculoskeletal:     Cervical back: Neck supple.     Right lower leg: No edema.     Left lower leg: No edema.  Lymphadenopathy:     Cervical: No cervical adenopathy.  Skin:    General: Skin is warm and dry.     Findings: No rash.  Neurological:     Mental Status: She is alert. Mental status is at baseline.  Psychiatric:        Mood and Affect: Mood normal.        Behavior: Behavior normal.        Lab Results  Component Value Date  WBC 5.6 08/31/2020   HGB 12.9 08/31/2020   HCT 37.6 08/31/2020   PLT 272.0 08/31/2020   GLUCOSE 98 09/30/2021   CHOL 137 09/30/2021   TRIG 110.0 09/30/2021   HDL 45.90 09/30/2021   LDLDIRECT 90.0 08/31/2020   LDLCALC 69 09/30/2021   ALT 15 09/30/2021   AST 22 09/30/2021   NA 142 09/30/2021   K 4.1 09/30/2021   CL 106 09/30/2021   CREATININE 1.48 (H) 09/30/2021   BUN 16 09/30/2021   CO2 28 09/30/2021   TSH 2.23 10/01/2015   HGBA1C 5.5 09/30/2021   MICROALBUR 1.4 09/30/2021     Assessment & Plan:    See Problem List for Assessment and Plan of chronic medical problems.

## 2022-01-30 ENCOUNTER — Ambulatory Visit (INDEPENDENT_AMBULATORY_CARE_PROVIDER_SITE_OTHER): Payer: Managed Care, Other (non HMO) | Admitting: Internal Medicine

## 2022-01-30 ENCOUNTER — Other Ambulatory Visit: Payer: Self-pay

## 2022-01-30 VITALS — BP 138/82 | HR 75 | Temp 98.0°F | Ht 63.0 in | Wt 190.2 lb

## 2022-01-30 DIAGNOSIS — Z13820 Encounter for screening for osteoporosis: Secondary | ICD-10-CM

## 2022-01-30 DIAGNOSIS — F419 Anxiety disorder, unspecified: Secondary | ICD-10-CM

## 2022-01-30 DIAGNOSIS — F3289 Other specified depressive episodes: Secondary | ICD-10-CM

## 2022-01-30 DIAGNOSIS — Z23 Encounter for immunization: Secondary | ICD-10-CM

## 2022-01-30 DIAGNOSIS — E1165 Type 2 diabetes mellitus with hyperglycemia: Secondary | ICD-10-CM

## 2022-01-30 DIAGNOSIS — E7849 Other hyperlipidemia: Secondary | ICD-10-CM

## 2022-01-30 DIAGNOSIS — I10 Essential (primary) hypertension: Secondary | ICD-10-CM

## 2022-01-30 DIAGNOSIS — N184 Chronic kidney disease, stage 4 (severe): Secondary | ICD-10-CM

## 2022-01-30 LAB — COMPREHENSIVE METABOLIC PANEL
ALT: 15 U/L (ref 0–35)
AST: 32 U/L (ref 0–37)
Albumin: 4.2 g/dL (ref 3.5–5.2)
Alkaline Phosphatase: 76 U/L (ref 39–117)
BUN: 22 mg/dL (ref 6–23)
CO2: 28 mEq/L (ref 19–32)
Calcium: 9.9 mg/dL (ref 8.4–10.5)
Chloride: 106 mEq/L (ref 96–112)
Creatinine, Ser: 1.77 mg/dL — ABNORMAL HIGH (ref 0.40–1.20)
GFR: 27.79 mL/min — ABNORMAL LOW (ref >60.00)
Glucose, Bld: 94 mg/dL (ref 70–99)
Potassium: 4.2 mEq/L (ref 3.5–5.1)
Sodium: 140 mEq/L (ref 135–145)
Total Bilirubin: 1 mg/dL (ref 0.2–1.2)
Total Protein: 7.4 g/dL (ref 6.0–8.3)

## 2022-01-30 LAB — HEMOGLOBIN A1C: Hgb A1c MFr Bld: 5.9 % (ref 4.6–6.5)

## 2022-01-30 MED ORDER — TIRZEPATIDE 10 MG/0.5ML ~~LOC~~ SOAJ: 10.0000 mg | SUBCUTANEOUS | 0 refills | Status: DC

## 2022-01-30 NOTE — Assessment & Plan Note (Signed)
Chronic Following with nephrology Sugars well controlled, blood pressure well controlled Not taking any NSAIDs Stressed good water intake Continue weight loss efforts-Mounjaro is helping BMP

## 2022-01-30 NOTE — Addendum Note (Signed)
Addended by: Marcina Millard on: 01/30/2022 08:49 AM   Modules accepted: Orders

## 2022-01-30 NOTE — Assessment & Plan Note (Signed)
Chronic LDL has been well controlled Continue Crestor 40 mg daily

## 2022-01-30 NOTE — Assessment & Plan Note (Addendum)
Chronic  Lab Results  Component Value Date   HGBA1C 5.5 09/30/2021   Sugars very well controlled Check A1c Increase Mounjaro to 10  mg weekly Stressed regular exercise, diabetic diet

## 2022-01-30 NOTE — Assessment & Plan Note (Signed)
Chronic Blood pressure well controlled CMP Continue losartan 25 mg daily 

## 2022-01-30 NOTE — Assessment & Plan Note (Signed)
Chronic Controlled, Stable Continue venlafaxine 150 mg daily 

## 2022-04-08 ENCOUNTER — Other Ambulatory Visit: Payer: Self-pay | Admitting: Internal Medicine

## 2022-04-08 DIAGNOSIS — E1165 Type 2 diabetes mellitus with hyperglycemia: Secondary | ICD-10-CM

## 2022-04-11 ENCOUNTER — Encounter: Payer: Self-pay | Admitting: Internal Medicine

## 2022-04-11 NOTE — Progress Notes (Unsigned)
    Subjective:    Patient ID: Karsten Ro, female    DOB: 11-Mar-1946, 77 y.o.   MRN: [id number]      HPI Makaya is here for No chief complaint on file.    Stomach issues -     Medications and allergies reviewed with patient and updated if appropriate.  Current Outpatient Medications on File Prior to Visit  Medication Sig Dispense Refill   FARXIGA 10 MG TABS tablet TAKE 1 TABLET BY MOUTH  DAILY 90 tablet 3   loperamide (IMODIUM) 2 MG capsule Take 1 capsule (2 mg total) by mouth daily. (Patient not taking: Reported on 09/30/2021) 90 capsule 1   losartan (COZAAR) 25 MG tablet Take 1 tablet (25 mg total) by mouth daily. 90 tablet 2   rosuvastatin (CRESTOR) 40 MG tablet Take 1 tablet (40 mg total) by mouth daily. 90 tablet 3   tirzepatide (MOUNJARO) 10 MG/0.5ML Pen Inject 10 mg into the skin once a week. 6 mL 0   tirzepatide (MOUNJARO) 7.5 MG/0.5ML Pen INJECT 7.5 MG SUBCUTANEOUSLY WEEKLY 6 mL 0   venlafaxine XR (EFFEXOR-XR) 150 MG 24 hr capsule TAKE 1 CAPSULE BY MOUTH DAILY  WITH BREAKFAST 90 capsule 3   No current facility-administered medications on file prior to visit.    Review of Systems     Objective:  There were no vitals filed for this visit. BP Readings from Last 3 Encounters:  01/30/22 138/82  09/30/21 132/74  07/18/21 (!) 144/85   Wt Readings from Last 3 Encounters:  01/30/22 190 lb 3.2 oz (86.3 kg)  09/30/21 202 lb 8 oz (91.9 kg)  07/18/21 212 lb 6 oz (96.3 kg)   There is no height or weight on file to calculate BMI.    Physical Exam         Assessment & Plan:    See Problem List for Assessment and Plan of chronic medical problems.

## 2022-04-12 ENCOUNTER — Ambulatory Visit (INDEPENDENT_AMBULATORY_CARE_PROVIDER_SITE_OTHER): Payer: 59 | Admitting: Internal Medicine

## 2022-04-12 VITALS — BP 130/74 | HR 70 | Temp 97.9°F | Ht 63.0 in | Wt 186.0 lb

## 2022-04-12 DIAGNOSIS — F419 Anxiety disorder, unspecified: Secondary | ICD-10-CM | POA: Diagnosis not present

## 2022-04-12 DIAGNOSIS — E1165 Type 2 diabetes mellitus with hyperglycemia: Secondary | ICD-10-CM | POA: Diagnosis not present

## 2022-04-12 DIAGNOSIS — F3289 Other specified depressive episodes: Secondary | ICD-10-CM

## 2022-04-12 DIAGNOSIS — I10 Essential (primary) hypertension: Secondary | ICD-10-CM | POA: Diagnosis not present

## 2022-04-12 NOTE — Assessment & Plan Note (Signed)
Chronic Sugars well-controlled Continue Mounjaro 10 mg weekly for now-can consider 7.5 or 10 mg weekly At this point I do not think we need to continue to increase the medication Continue to work on being active, healthy diet, decrease portions, low sugar/carb diet Continue Farxiga 10 mg daily for renal protection Will check blood work at her upcoming appointment in a few months

## 2022-04-12 NOTE — Assessment & Plan Note (Signed)
Chronic Controlled, Stable Continue Effexor 150 mg daily 

## 2022-04-12 NOTE — Patient Instructions (Addendum)
    Medications changes include :   none    

## 2022-04-12 NOTE — Assessment & Plan Note (Signed)
Chronic Blood pressure is well-controlled Continue losartan 25 mg daily

## 2022-04-20 ENCOUNTER — Encounter: Payer: Self-pay | Admitting: Internal Medicine

## 2022-04-20 ENCOUNTER — Telehealth: Payer: Self-pay | Admitting: Internal Medicine

## 2022-04-20 NOTE — Progress Notes (Signed)
    Subjective:    Patient ID: Veronica Carrillo, female    DOB: Aug 13, 1945, 77 y.o.   MRN: [id number]      HPI Veronica Carrillo is here for  Chief Complaint  Patient presents with   Diarrhea    Diarrhea started Wednesday overnight (has been out of work/needs note) Still having Diarrhea     Diarrhea - two nights ago she got sick - it just hit her she had abdominal pain, N/V and diarrhea.  Symptoms persisted all day yesterday.  No fever.  Today she does feel better.  She denies any nausea, vomiting or diarrhea.  She still has decreased appetite.  She feels very weak.    She has been drinking fluids.  She had some yogurt and saltine crackers.    Medications and allergies reviewed with patient and updated if appropriate.  Current Outpatient Medications on File Prior to Visit  Medication Sig Dispense Refill   FARXIGA 10 MG TABS tablet TAKE 1 TABLET BY MOUTH  DAILY 90 tablet 3   losartan (COZAAR) 25 MG tablet Take 1 tablet (25 mg total) by mouth daily. 90 tablet 2   rosuvastatin (CRESTOR) 40 MG tablet Take 1 tablet (40 mg total) by mouth daily. 90 tablet 3   tirzepatide (MOUNJARO) 10 MG/0.5ML Pen Inject 10 mg into the skin once a week. 6 mL 0   venlafaxine XR (EFFEXOR-XR) 150 MG 24 hr capsule TAKE 1 CAPSULE BY MOUTH DAILY  WITH BREAKFAST 90 capsule 3   No current facility-administered medications on file prior to visit.    Review of Systems  Constitutional:  Positive for fatigue. Negative for fever.  HENT:  Negative for congestion and sore throat.   Respiratory:  Negative for cough.   Gastrointestinal:  Positive for abdominal pain, diarrhea, nausea and vomiting. Negative for blood in stool.  Musculoskeletal:  Negative for myalgias.  Neurological:  Negative for dizziness, light-headedness and headaches.       Objective:   Vitals:   04/21/22 0925  BP: 126/80  Pulse: 100  Temp: 98.3 F (36.8 C)  SpO2: 99%   BP Readings from Last 3 Encounters:  04/21/22 126/80  04/12/22  130/74  01/30/22 138/82   Wt Readings from Last 3 Encounters:  04/12/22 186 lb (84.4 kg)  01/30/22 190 lb 3.2 oz (86.3 kg)  09/30/21 202 lb 8 oz (91.9 kg)   Body mass index is 32.95 kg/m.    Physical Exam Constitutional:      General: She is not in acute distress.    Appearance: Normal appearance. She is not ill-appearing.  HENT:     Head: Normocephalic and atraumatic.  Abdominal:     General: There is no distension.     Palpations: Abdomen is soft.     Tenderness: There is no abdominal tenderness. There is no guarding or rebound.  Skin:    General: Skin is warm and dry.  Neurological:     Mental Status: She is alert.            Assessment & Plan:    See Problem List for Assessment and Plan of chronic medical problems.

## 2022-04-20 NOTE — Telephone Encounter (Signed)
Patient requested a call back from nurse, She said she's been out of work since Wednesday. She wanted to speak to someone clinical. Callback is [phone]

## 2022-04-20 NOTE — Telephone Encounter (Signed)
Spoke with patient today.  She has been having diarrhea since Tuesday.  Appointment made for tomorrow.

## 2022-04-21 ENCOUNTER — Ambulatory Visit (INDEPENDENT_AMBULATORY_CARE_PROVIDER_SITE_OTHER): Payer: 59 | Admitting: Internal Medicine

## 2022-04-21 VITALS — BP 126/80 | HR 100 | Temp 98.3°F | Ht 63.0 in

## 2022-04-21 DIAGNOSIS — I10 Essential (primary) hypertension: Secondary | ICD-10-CM

## 2022-04-21 DIAGNOSIS — A084 Viral intestinal infection, unspecified: Secondary | ICD-10-CM | POA: Insufficient documentation

## 2022-04-21 NOTE — Patient Instructions (Addendum)
Continue a bland diet and increase your diet slowly.     Continue increased fluids.      Return if symptoms worsen or fail to improve.    Viral Gastroenteritis, Adult  Viral gastroenteritis is also known as the stomach flu. This condition may affect your stomach, your small intestine, and your large intestine. It can cause sudden watery poop (diarrhea), fever, and vomiting. This condition is caused by certain germs (viruses). These germs can be passed from person to person very easily (are contagious). Having watery poop and vomiting can make you feel weak and cause you to not have enough water in your body (get dehydrated). This can make you tired and thirsty, make you have a dry mouth, and make it so you pee (urinate) less often. It is important to replace the fluids that you lose from having watery poop and vomiting. What are the causes? You can get sick by catching germs from other people. You can also get sick by: Eating food, drinking water, or touching a surface that has the germs on it (is contaminated). Sharing utensils or other personal items with a person who is sick. What increases the risk? Having a weak body defense system (immune system). Living with one or more children who are younger than 2 years. Living in a nursing home. Going on cruise ships. What are the signs or symptoms? Symptoms of this condition start suddenly. Symptoms may last for a few days or for as long as a week. Common symptoms include: Watery poop. Vomiting. Other symptoms include: Fever. Headache. Feeling tired (fatigue). Pain in the belly (abdomen). Chills. Feeling weak. Feeling like you may vomit (nauseous). Muscle aches. Not feeling hungry. How is this treated? This condition typically goes away on its own. The focus of treatment is to replace the fluids that you lose. This condition may be treated with: An ORS (oral rehydration solution). This is a drink that helps you replace  fluids and minerals your body lost. It is sold at pharmacies and stores. Medicines to help with your symptoms. Probiotic supplements to reduce symptoms of watery poop. Fluids given through an IV tube, if needed. Older adults and people with other diseases or a weak body defense system are at higher risk for not having enough water in the body. Follow these instructions at home: Eating and drinking  Take an ORS as told by your doctor. Drink clear fluids in small amounts as you are able. Clear fluids include: Water. Ice chips. Fruit juice that has water added to it (is diluted). Low-calorie sports drinks. Drink enough fluid to keep your pee (urine) pale yellow. Eat small amounts of healthy foods every 3-4 hours as you are able. This may include whole grains, fruits, vegetables, lean meats, and yogurt. Avoid fluids that have a lot of sugar or caffeine in them. This includes energy drinks, sports drinks, and soda. Avoid spicy or fatty foods. Avoid alcohol. General instructions  Wash your hands often. This is very important after you have watery poop or you vomit. If you cannot use soap and water, use hand sanitizer. Make sure that all people in your home wash their hands well and often. Take over-the-counter and prescription medicines only as told by your doctor. Rest at home while you get better. Watch your condition for any changes. Take a warm bath to help with any burning or pain from having watery poop. Keep all follow-up visits. Contact a doctor if: You cannot keep fluids down. Your symptoms  get worse. You have new symptoms. You feel light-headed or dizzy. You have muscle cramps. Get help right away if: You have chest pain. You have trouble breathing, or you are breathing very fast. You have a fast heartbeat. You feel very weak or you faint. You have a very bad headache, a stiff neck, or both. You have a rash. You have very bad pain, cramping, or bloating in your  belly. Your skin feels cold and clammy. You feel mixed up (confused). You have pain when you pee. You have signs of not having enough water in the body, such as: Dark pee, hardly any pee, or no pee. Cracked lips. Dry mouth. Sunken eyes. Feeling very sleepy. Feeling weak. You have signs of bleeding, such as: You see blood in your vomit. Your vomit looks like coffee grounds. You have bloody or black poop or poop that looks like tar. These symptoms may be an emergency. Get help right away. Call 911. Do not wait to see if the symptoms will go away. Do not drive yourself to the hospital. Summary Viral gastroenteritis is also known as the stomach flu. This condition can cause sudden watery poop (diarrhea), fever, and vomiting. These germs can be passed from person to person very easily. Take an ORS (oral rehydration solution) as told by your doctor. This is a drink that is sold at pharmacies and stores. Wash your hands often, especially after having watery poop or vomiting. If you cannot use soap and water, use hand sanitizer. This information is not intended to replace advice given to you by your health care provider. Make sure you discuss any questions you have with your health care provider. Document Revised: 01/24/2021 Document Reviewed: 01/24/2021 Elsevier Patient Education  [employer].

## 2022-04-21 NOTE — Assessment & Plan Note (Signed)
Acute Symptoms improved Symptoms likely secondary to viral gastroenteritis Today she has not had any nausea, vomiting or diarrhea She does feel very weak/tired and has decreased appetite-decreased appetite is not necessarily new Stressed continuing with increased fluids and continue increased rest Continue bland diet and slowly increase-encouraged her to try to eat more which may help with her energy level Call if any symptoms recur or if she has any concerns

## 2022-04-21 NOTE — Assessment & Plan Note (Signed)
Chronic Blood pressure well-controlled and not too low given current symptoms Continue losartan 25 mg daily-stressed staying well-hydrated especially when sick

## 2022-05-22 LAB — HM DIABETES EYE EXAM

## 2022-05-25 ENCOUNTER — Other Ambulatory Visit: Payer: Self-pay | Admitting: Internal Medicine

## 2022-05-29 ENCOUNTER — Other Ambulatory Visit: Payer: Self-pay | Admitting: Internal Medicine

## 2022-05-29 DIAGNOSIS — Z1231 Encounter for screening mammogram for malignant neoplasm of breast: Secondary | ICD-10-CM

## 2022-05-31 ENCOUNTER — Other Ambulatory Visit: Payer: Self-pay

## 2022-05-31 ENCOUNTER — Other Ambulatory Visit (HOSPITAL_COMMUNITY): Payer: Self-pay

## 2022-05-31 MED ORDER — MOUNJARO 10 MG/0.5ML ~~LOC~~ SOAJ
10.0000 mg | SUBCUTANEOUS | 0 refills | Status: DC
  Filled 2022-05-31 – 2022-07-10 (×2): qty 2, 28d supply, fill #0

## 2022-05-31 MED ORDER — TIRZEPATIDE 10 MG/0.5ML ~~LOC~~ SOAJ
10.0000 mg | SUBCUTANEOUS | 0 refills | Status: DC
  Filled 2022-05-31: qty 2, 28d supply, fill #0
  Filled 2022-08-08: qty 2, 28d supply, fill #1
  Filled 2022-09-06: qty 2, 28d supply, fill #2

## 2022-06-09 ENCOUNTER — Other Ambulatory Visit (HOSPITAL_COMMUNITY): Payer: Self-pay

## 2022-06-12 ENCOUNTER — Other Ambulatory Visit (HOSPITAL_COMMUNITY): Payer: Self-pay

## 2022-06-15 ENCOUNTER — Encounter: Payer: Self-pay | Admitting: Internal Medicine

## 2022-06-15 NOTE — Progress Notes (Signed)
Duplicate

## 2022-06-16 ENCOUNTER — Other Ambulatory Visit (HOSPITAL_COMMUNITY): Payer: Self-pay

## 2022-06-27 ENCOUNTER — Telehealth: Payer: Self-pay

## 2022-06-27 NOTE — Telephone Encounter (Signed)
Called patient to schedule Medicare Annual Wellness Visit (AWV). Left message for patient to call back and schedule Medicare Annual Wellness Visit (AWV).  Last date of AWV: 06/29/21  Please schedule an appointment at any time.   Norton Blizzard, [place] ([facility])  [facility] [phone]

## 2022-07-11 ENCOUNTER — Ambulatory Visit
Admission: RE | Admit: 2022-07-11 | Discharge: 2022-07-11 | Disposition: A | Discharge status: Home / Self Care | Payer: 59 | Source: Ambulatory Visit | Attending: Internal Medicine | Admitting: Internal Medicine

## 2022-07-11 ENCOUNTER — Other Ambulatory Visit (HOSPITAL_COMMUNITY): Payer: Self-pay

## 2022-07-11 DIAGNOSIS — Z1231 Encounter for screening mammogram for malignant neoplasm of breast: Secondary | ICD-10-CM

## 2022-07-13 ENCOUNTER — Other Ambulatory Visit (HOSPITAL_COMMUNITY): Payer: Self-pay

## 2022-07-14 ENCOUNTER — Other Ambulatory Visit (HOSPITAL_COMMUNITY): Payer: Self-pay

## 2022-08-02 ENCOUNTER — Encounter: Payer: Managed Care, Other (non HMO) | Admitting: Internal Medicine

## 2022-08-08 ENCOUNTER — Other Ambulatory Visit (HOSPITAL_COMMUNITY): Payer: Self-pay

## 2022-08-08 ENCOUNTER — Other Ambulatory Visit: Payer: Self-pay

## 2022-08-09 ENCOUNTER — Other Ambulatory Visit: Payer: Self-pay | Admitting: Internal Medicine

## 2022-09-06 ENCOUNTER — Other Ambulatory Visit (HOSPITAL_COMMUNITY): Payer: Self-pay

## 2022-09-07 ENCOUNTER — Encounter: Payer: Managed Care, Other (non HMO) | Admitting: Internal Medicine

## 2022-09-12 ENCOUNTER — Other Ambulatory Visit: Payer: Self-pay | Admitting: Internal Medicine

## 2022-09-18 ENCOUNTER — Other Ambulatory Visit (HOSPITAL_COMMUNITY): Payer: Self-pay

## 2022-09-18 ENCOUNTER — Telehealth: Payer: Self-pay | Admitting: Internal Medicine

## 2022-09-18 ENCOUNTER — Other Ambulatory Visit: Payer: Self-pay

## 2022-09-18 MED ORDER — TIRZEPATIDE 10 MG/0.5ML ~~LOC~~ SOAJ
10.0000 mg | SUBCUTANEOUS | 0 refills | Status: DC
  Filled 2022-09-18: qty 6, 84d supply, fill #0

## 2022-09-18 MED ORDER — TIRZEPATIDE 7.5 MG/0.5ML ~~LOC~~ SOAJ
7.5000 mg | SUBCUTANEOUS | 0 refills | Status: DC
  Filled 2022-09-18: qty 2, 28d supply, fill #0
  Filled 2022-10-16: qty 2, 28d supply, fill #1

## 2022-09-18 NOTE — Telephone Encounter (Signed)
Patient called and said she has been out of [facility] for 3 weeks. She's called around and can't find it at any pharmacy. She would like to know what PCP would recommend. 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 She would like a call back at [phone].

## 2022-09-18 NOTE — Telephone Encounter (Signed)
Spoke with patient.  Mounjaro 7.5 mg sent to [facility] per patient request. Also sent 10 mg to fill once back in stock.  Scripts sent to [facility] pharmacy

## 2022-09-18 NOTE — Telephone Encounter (Signed)
We can try Ozempic or Trulicity if she wants-both are indicated for diabetes.  I do not know if 1 or both will be covered by insurance and at times both of them are also difficult to get but those are the best options.

## 2022-09-20 ENCOUNTER — Other Ambulatory Visit: Payer: Self-pay | Admitting: Internal Medicine

## 2022-09-20 NOTE — Telephone Encounter (Signed)
Patient called and said she had cataract surgery in May and has been on antibiotics. She's wondering if she has a yeast infection. She declined coming in since she has an appointment scheduled for 09/25/2022 for a physical. She would like a call back at [phone].

## 2022-09-20 NOTE — Telephone Encounter (Signed)
Diflucan pending-she can try this if she wants, but if not better may need to be seen.  Prescription pending.

## 2022-09-24 NOTE — Progress Notes (Signed)
Subjective:    Patient ID: Veronica Carrillo, female    DOB: [birth year], 77 y.o.   MRN: [id number]      HPI Veronica Carrillo is here for a Physical exam and her chronic medical problems.    Moving back to [place].    Off mounjaro for about one month - finally got it back.    Had b/l cataracts  Leg cramp in RLE intermittently - rubs it and it goes away.  Typically does not have any cramping in the left lower extremity  Medications and allergies reviewed with patient and updated if appropriate.  Current Outpatient Medications on File Prior to Visit  Medication Sig Dispense Refill   FARXIGA 10 MG TABS tablet TAKE 1 TABLET BY MOUTH  DAILY 90 tablet 3   losartan (COZAAR) 25 MG tablet Take 1 tablet (25 mg total) by mouth daily. 90 tablet 2   rosuvastatin (CRESTOR) 40 MG tablet TAKE 1 TABLET BY MOUTH DAILY 90 tablet 3   tirzepatide (MOUNJARO) 7.5 MG/0.5ML Pen Inject 7.5 mg into the skin once a week. 6 mL 0   venlafaxine XR (EFFEXOR-XR) 150 MG 24 hr capsule TAKE 1 CAPSULE BY MOUTH DAILY  WITH BREAKFAST 90 capsule 3   No current facility-administered medications on file prior to visit.    Review of Systems  Constitutional:  Negative for fever.  Eyes:  Negative for visual disturbance.  Respiratory:  Negative for cough, shortness of breath and wheezing.   Cardiovascular:  Negative for chest pain, palpitations and leg swelling.  Gastrointestinal:  Negative for abdominal pain, blood in stool, constipation and diarrhea.       No gerd  Genitourinary:  Negative for dysuria.  Musculoskeletal:  Negative for arthralgias and back pain.  Skin:  Negative for rash.  Neurological:  Negative for light-headedness and headaches.  Psychiatric/Behavioral:  Negative for dysphoric mood. The patient is not nervous/anxious.        Objective:   Vitals:   09/25/22 1527  BP: 120/70  Pulse: 90  Temp: 98.1 F (36.7 C)  SpO2: 97%   Filed Weights   09/25/22 1527  Weight: 182 lb 3.2 oz (82.6 kg)    Body mass index is 32.28 kg/m.  BP Readings from Last 3 Encounters:  09/25/22 120/70  04/21/22 126/80  04/12/22 130/74    Wt Readings from Last 3 Encounters:  09/25/22 182 lb 3.2 oz (82.6 kg)  04/12/22 186 lb (84.4 kg)  01/30/22 190 lb 3.2 oz (86.3 kg)       Physical Exam Constitutional: She appears well-developed and well-nourished. No distress.  HENT:  Head: Normocephalic and atraumatic.  Right Ear: External ear normal. Normal ear canal and TM Left Ear: External ear normal.  Normal ear canal and TM Mouth/Throat: Oropharynx is clear and moist.  Eyes: Conjunctivae normal.  Neck: Neck supple. No tracheal deviation present. No thyromegaly present.  No carotid bruit  Cardiovascular: Normal rate, regular rhythm and normal heart sounds.   No murmur heard.  No edema. Pulmonary/Chest: Effort normal and breath sounds normal. No respiratory distress. She has no wheezes. She has no rales.  Breast: deferred   Abdominal: Soft. She exhibits no distension. There is no tenderness.  Lymphadenopathy: She has no cervical adenopathy.  Skin: Skin is warm and dry. She is not diaphoretic.  Psychiatric: She has a normal mood and affect. Her behavior is normal.     Lab Results  Component Value Date   WBC 5.6 08/31/2020   HGB 12.9 08/31/2020  HCT 37.6 08/31/2020   PLT 272.0 08/31/2020   GLUCOSE 94 01/30/2022   CHOL 137 09/30/2021   TRIG 110.0 09/30/2021   HDL 45.90 09/30/2021   LDLDIRECT 90.0 08/31/2020   LDLCALC 69 09/30/2021   ALT 15 01/30/2022   AST 32 01/30/2022   NA 140 01/30/2022   K 4.2 01/30/2022   CL 106 01/30/2022   CREATININE 1.77 (H) 01/30/2022   BUN 22 01/30/2022   CO2 28 01/30/2022   TSH 2.23 10/01/2015   HGBA1C 5.9 Repeated and verified X2. 01/30/2022   MICROALBUR 1.4 09/30/2021   Diabetic Foot Exam - Simple   Simple Foot Form Diabetic Foot exam was performed with the following findings: Yes 09/25/2022  4:20 PM  Visual Inspection No deformities, no  ulcerations, no other skin breakdown bilaterally: Yes Sensation Testing Intact to touch and monofilament testing bilaterally: Yes Pulse Check Posterior Tibialis and Dorsalis pulse intact bilaterally: Yes Comments          Assessment & Plan:   Physical exam: Screening blood work  ordered Exercise   walks a little - not consistent Weight  working on weight loss Substance abuse  none   Reviewed recommended immunizations.   Health Maintenance  Topic Date Due   COVID-19 Vaccine (4 - 2023-24 season) 12/09/2021   Medicare Annual Wellness (AWV)  06/30/2022   HEMOGLOBIN A1C  08/01/2022   Diabetic kidney evaluation - Urine ACR  10/01/2022   DEXA SCAN  01/31/2023 (Originally 12/19/2021)   INFLUENZA VACCINE  11/09/2022   Diabetic kidney evaluation - eGFR measurement  01/31/2023   OPHTHALMOLOGY EXAM  05/23/2023   FOOT EXAM  09/25/2023   DTaP/Tdap/Td (2 - Td or Tdap) 01/30/2028   Pneumonia Vaccine 53+ Years old  Completed   Hepatitis C Screening  Completed   Zoster Vaccines- Shingrix  Completed   HPV VACCINES  Aged Out   Colonoscopy  Discontinued          See Problem List for Assessment and Plan of chronic medical problems.

## 2022-09-24 NOTE — Patient Instructions (Addendum)
Blood work was ordered.   The lab is on the first floor.    Medications changes include :   none     Return in about 6 months (around 03/27/2023) for follow up if you are still here .   Health Maintenance, Female Adopting a healthy lifestyle and getting preventive care are important in promoting health and wellness. Ask your health care provider about: The right schedule for you to have regular tests and exams. Things you can do on your own to prevent diseases and keep yourself healthy. What should I know about diet, weight, and exercise? Eat a healthy diet  Eat a diet that includes plenty of vegetables, fruits, low-fat dairy products, and lean protein. Do not eat a lot of foods that are high in solid fats, added sugars, or sodium. Maintain a healthy weight Body mass index (BMI) is used to identify weight problems. It estimates body fat based on height and weight. Your health care provider can help determine your BMI and help you achieve or maintain a healthy weight. Get regular exercise Get regular exercise. This is one of the most important things you can do for your health. Most adults should: Exercise for at least 150 minutes each week. The exercise should increase your heart rate and make you sweat (moderate-intensity exercise). Do strengthening exercises at least twice a week. This is in addition to the moderate-intensity exercise. Spend less time sitting. Even light physical activity can be beneficial. Watch cholesterol and blood lipids Have your blood tested for lipids and cholesterol at 76 years of age, then have this test every 5 years. Have your cholesterol levels checked more often if: Your lipid or cholesterol levels are high. You are older than 76 years of age. You are at high risk for heart disease. What should I know about cancer screening? Depending on your health history and family history, you may need to have cancer screening at various ages. This may  include screening for: Breast cancer. Cervical cancer. Colorectal cancer. Skin cancer. Lung cancer. What should I know about heart disease, diabetes, and high blood pressure? Blood pressure and heart disease High blood pressure causes heart disease and increases the risk of stroke. This is more likely to develop in people who have high blood pressure readings or are overweight. Have your blood pressure checked: Every 3-5 years if you are 37-91 years of age. Every year if you are 49 years old or older. Diabetes Have regular diabetes screenings. This checks your fasting blood sugar level. Have the screening done: Once every three years after age 79 if you are at a normal weight and have a low risk for diabetes. More often and at a younger age if you are overweight or have a high risk for diabetes. What should I know about preventing infection? Hepatitis B If you have a higher risk for hepatitis B, you should be screened for this virus. Talk with your health care provider to find out if you are at risk for hepatitis B infection. Hepatitis C Testing is recommended for: Everyone born from [birth year] through 1964. Anyone with known risk factors for hepatitis C. Sexually transmitted infections (STIs) Get screened for STIs, including gonorrhea and chlamydia, if: You are sexually active and are younger than 76 years of age. You are older than 76 years of age and your health care provider tells you that you are at risk for this type of infection. Your sexual activity has changed since you were  last screened, and you are at increased risk for chlamydia or gonorrhea. Ask your health care provider if you are at risk. Ask your health care provider about whether you are at high risk for HIV. Your health care provider may recommend a prescription medicine to help prevent HIV infection. If you choose to take medicine to prevent HIV, you should first get tested for HIV. You should then be tested every 3 months  for as long as you are taking the medicine. Pregnancy If you are about to stop having your period (premenopausal) and you may become pregnant, seek counseling before you get pregnant. Take 400 to 800 micrograms (mcg) of folic acid every day if you become pregnant. Ask for birth control (contraception) if you want to prevent pregnancy. Osteoporosis and menopause Osteoporosis is a disease in which the bones lose minerals and strength with aging. This can result in bone fractures. If you are 37 years old or older, or if you are at risk for osteoporosis and fractures, ask your health care provider if you should: Be screened for bone loss. Take a calcium or vitamin D supplement to lower your risk of fractures. Be given hormone replacement therapy (HRT) to treat symptoms of menopause. Follow these instructions at home: Alcohol use Do not drink alcohol if: Your health care provider tells you not to drink. You are pregnant, may be pregnant, or are planning to become pregnant. If you drink alcohol: Limit how much you have to: 0-1 drink a day. Know how much alcohol is in your drink. In the [place] one drink equals one 12 oz bottle of beer (355 mL), one 5 oz glass of wine (148 mL), or one 1 oz glass of hard liquor (44 mL). Lifestyle Do not use any products that contain nicotine or tobacco. These products include cigarettes, chewing tobacco, and vaping devices, such as e-cigarettes. If you need help quitting, ask your health care provider. Do not use street drugs. Do not share needles. Ask your health care provider for help if you need support or information about quitting drugs. General instructions Schedule regular health, dental, and eye exams. Stay current with your vaccines. Tell your health care provider if: You often feel depressed. You have ever been abused or do not feel safe at home. Summary Adopting a healthy lifestyle and getting preventive care are important in promoting health and  wellness. Follow your health care provider's instructions about healthy diet, exercising, and getting tested or screened for diseases. Follow your health care provider's instructions on monitoring your cholesterol and blood pressure. This information is not intended to replace advice given to you by your health care provider. Make sure you discuss any questions you have with your health care provider. Document Revised: 08/16/2020 Document Reviewed: 08/16/2020 Elsevier Patient Education  2024 [employer].

## 2022-09-25 ENCOUNTER — Ambulatory Visit (INDEPENDENT_AMBULATORY_CARE_PROVIDER_SITE_OTHER): Payer: 59 | Admitting: Internal Medicine

## 2022-09-25 ENCOUNTER — Encounter: Payer: Self-pay | Admitting: Internal Medicine

## 2022-09-25 VITALS — BP 120/70 | HR 90 | Temp 98.1°F | Ht 63.0 in | Wt 182.2 lb

## 2022-09-25 DIAGNOSIS — F419 Anxiety disorder, unspecified: Secondary | ICD-10-CM

## 2022-09-25 DIAGNOSIS — E1165 Type 2 diabetes mellitus with hyperglycemia: Secondary | ICD-10-CM

## 2022-09-25 DIAGNOSIS — F3289 Other specified depressive episodes: Secondary | ICD-10-CM

## 2022-09-25 DIAGNOSIS — E669 Obesity, unspecified: Secondary | ICD-10-CM

## 2022-09-25 DIAGNOSIS — Z0000 Encounter for general adult medical examination without abnormal findings: Secondary | ICD-10-CM | POA: Diagnosis not present

## 2022-09-25 DIAGNOSIS — E7849 Other hyperlipidemia: Secondary | ICD-10-CM | POA: Diagnosis not present

## 2022-09-25 DIAGNOSIS — I10 Essential (primary) hypertension: Secondary | ICD-10-CM

## 2022-09-25 DIAGNOSIS — R252 Cramp and spasm: Secondary | ICD-10-CM

## 2022-09-25 DIAGNOSIS — N184 Chronic kidney disease, stage 4 (severe): Secondary | ICD-10-CM | POA: Diagnosis not present

## 2022-09-25 DIAGNOSIS — Z7985 Long-term (current) use of injectable non-insulin antidiabetic drugs: Secondary | ICD-10-CM

## 2022-09-25 DIAGNOSIS — Z7984 Long term (current) use of oral hypoglycemic drugs: Secondary | ICD-10-CM

## 2022-09-25 MED ORDER — VENLAFAXINE HCL ER 150 MG PO CP24: 150.0000 mg | ORAL_CAPSULE | Freq: Every day | ORAL | 1 refills | Status: DC

## 2022-09-25 MED ORDER — LOSARTAN POTASSIUM 25 MG PO TABS: 25.0000 mg | ORAL_TABLET | Freq: Every day | ORAL | 1 refills | Status: DC

## 2022-09-25 MED ORDER — FARXIGA 10 MG PO TABS: 10.0000 mg | ORAL_TABLET | Freq: Every day | ORAL | 1 refills | Status: DC

## 2022-09-25 MED ORDER — ROSUVASTATIN CALCIUM 40 MG PO TABS: 40.0000 mg | ORAL_TABLET | Freq: Every day | ORAL | 1 refills | Status: DC

## 2022-09-25 NOTE — Assessment & Plan Note (Signed)
New  Having intermittent cramping in her right lower extremity-posterior calf Typically occurs at rest Likely orthopedics that she does not have it in her left leg

## 2022-09-25 NOTE — Assessment & Plan Note (Signed)
Chronic ?Controlled, Stable ?Continue Effexor 150 mg daily ?

## 2022-09-25 NOTE — Assessment & Plan Note (Signed)
Chronic Following with nephrology Sugars well controlled, blood pressure well controlled Not taking any NSAIDs Stressed good water intake Continue weight loss efforts-Mounjaro is helping CMP

## 2022-09-25 NOTE — Assessment & Plan Note (Signed)
Chronic Has lost weight with Mounjaro Continue weight loss efforts Encouraged regular exercise, healthy diet with decreased portions Check TSH

## 2022-09-25 NOTE — Assessment & Plan Note (Signed)
Chronic Check lipids, cmp Continue Crestor 40 mg daily

## 2022-09-25 NOTE — Assessment & Plan Note (Signed)
Chronic Sugars well-controlled Continue Mounjaro  7.5 mg weekly Continue to work on being active, healthy diet, decrease portions, low sugar/carb diet Continue Farxiga 10 mg daily for renal protection Check a1c

## 2022-09-25 NOTE — Assessment & Plan Note (Signed)
Chronic Blood pressure well controlled CMP Continue losartan 25 mg daily 

## 2022-09-26 LAB — CBC WITH DIFFERENTIAL/PLATELET
Basophils Absolute: 0.1 10*3/uL (ref 0.0–0.1)
Basophils Relative: 1 % (ref 0.0–3.0)
Eosinophils Absolute: 0.2 10*3/uL (ref 0.0–0.7)
Eosinophils Relative: 3.2 % (ref 0.0–5.0)
HCT: 40.3 % (ref 36.0–46.0)
Hemoglobin: 13.2 g/dL (ref 12.0–15.0)
Lymphocytes Relative: 29.9 % (ref 12.0–46.0)
Lymphs Abs: 2.2 10*3/uL (ref 0.7–4.0)
MCHC: 32.8 g/dL (ref 30.0–36.0)
MCV: 87.1 fl (ref 78.0–100.0)
Monocytes Absolute: 0.7 10*3/uL (ref 0.1–1.0)
Monocytes Relative: 8.9 % (ref 3.0–12.0)
Neutro Abs: 4.3 10*3/uL (ref 1.4–7.7)
Neutrophils Relative %: 57 % (ref 43.0–77.0)
Platelets: 353 10*3/uL (ref 150.0–400.0)
RBC: 4.63 Mil/uL (ref 3.87–5.11)
RDW: 14.7 % (ref 11.5–15.5)
WBC: 7.5 10*3/uL (ref 4.0–10.5)

## 2022-09-26 LAB — COMPREHENSIVE METABOLIC PANEL
ALT: 17 U/L (ref 0–35)
AST: 24 U/L (ref 0–37)
Albumin: 4.1 g/dL (ref 3.5–5.2)
Alkaline Phosphatase: 94 U/L (ref 39–117)
BUN: 21 mg/dL (ref 6–23)
CO2: 29 mEq/L (ref 19–32)
Calcium: 9.9 mg/dL (ref 8.4–10.5)
Chloride: 104 mEq/L (ref 96–112)
Creatinine, Ser: 1.72 mg/dL — ABNORMAL HIGH (ref 0.40–1.20)
GFR: 28.63 mL/min — ABNORMAL LOW (ref >60.00)
Glucose, Bld: 87 mg/dL (ref 70–99)
Potassium: 4.9 mEq/L (ref 3.5–5.1)
Sodium: 140 mEq/L (ref 135–145)
Total Bilirubin: 0.8 mg/dL (ref 0.2–1.2)
Total Protein: 7.8 g/dL (ref 6.0–8.3)

## 2022-09-26 LAB — LIPID PANEL
Cholesterol: 203 mg/dL — ABNORMAL HIGH (ref 0–200)
HDL: 59.1 mg/dL (ref >39.00)
NonHDL: 144.03
Total CHOL/HDL Ratio: 3
Triglycerides: 210 mg/dL — ABNORMAL HIGH (ref 0.0–149.0)
VLDL: 42 mg/dL — ABNORMAL HIGH (ref 0.0–40.0)

## 2022-09-26 LAB — MICROALBUMIN / CREATININE URINE RATIO
Creatinine,U: 122.4 mg/dL
Microalb Creat Ratio: 1.1 mg/g (ref 0.0–30.0)
Microalb, Ur: 1.3 mg/dL (ref 0.0–1.9)

## 2022-09-26 LAB — TSH: TSH: 1.97 u[IU]/mL (ref 0.35–5.50)

## 2022-09-26 LAB — HEMOGLOBIN A1C: Hgb A1c MFr Bld: 5.6 % (ref 4.6–6.5)

## 2022-09-26 LAB — LDL CHOLESTEROL, DIRECT: Direct LDL: 111 mg/dL

## 2022-10-16 ENCOUNTER — Other Ambulatory Visit: Payer: Self-pay

## 2022-10-16 ENCOUNTER — Other Ambulatory Visit (HOSPITAL_COMMUNITY): Payer: Self-pay

## 2022-11-06 ENCOUNTER — Telehealth: Payer: Self-pay | Admitting: Internal Medicine

## 2022-11-06 NOTE — Telephone Encounter (Signed)
Patient would like an rx for the Shirley free style meter.  She is also on monjaro 7.5 - she would like to be bumped up to the 12 mg.

## 2022-11-07 NOTE — Telephone Encounter (Signed)
Not sure where she wants this sent.  Both prescriptions pending to be sent in-will go up to 10 mg of the Mounjaro-that is the next dose.

## 2022-11-08 ENCOUNTER — Other Ambulatory Visit: Payer: Self-pay

## 2022-11-08 ENCOUNTER — Other Ambulatory Visit (HOSPITAL_COMMUNITY): Payer: Self-pay

## 2022-11-08 DIAGNOSIS — E1165 Type 2 diabetes mellitus with hyperglycemia: Secondary | ICD-10-CM

## 2022-11-08 MED ORDER — FREESTYLE LIBRE 3 READER DEVI
0 refills | Status: AC
Start: 2022-11-08 — End: ?
  Filled 2022-11-08: qty 1, 90d supply, fill #0

## 2022-11-08 MED ORDER — TIRZEPATIDE 10 MG/0.5ML ~~LOC~~ SOAJ
10.0000 mg | SUBCUTANEOUS | 0 refills | Status: DC
  Filled 2022-11-08: qty 2, 28d supply, fill #0

## 2022-11-08 MED ORDER — FREESTYLE LIBRE 3 SENSOR MISC
6 refills | Status: AC
Start: 2022-11-08 — End: ?
  Filled 2022-11-08: qty 2, 28d supply, fill #0
  Filled 2022-12-13: qty 2, 28d supply, fill #1

## 2022-11-08 MED ORDER — FREESTYLE LIBRE 14 DAY SENSOR MISC
5 refills | Status: DC
  Filled 2022-11-08: qty 2, 28d supply, fill #0

## 2022-11-08 MED ORDER — FREESTYLE LIBRE 14 DAY READER DEVI
0 refills | Status: DC
  Filled 2022-11-08: qty 1, 1d supply, fill #0

## 2022-11-08 NOTE — Telephone Encounter (Signed)
Sent in today 

## 2022-11-09 ENCOUNTER — Other Ambulatory Visit (HOSPITAL_COMMUNITY): Payer: Self-pay

## 2022-11-09 ENCOUNTER — Other Ambulatory Visit: Payer: Self-pay

## 2022-11-13 ENCOUNTER — Other Ambulatory Visit (HOSPITAL_COMMUNITY): Payer: Self-pay

## 2022-12-04 NOTE — Progress Notes (Signed)
No show

## 2022-12-10 IMAGING — MG MM DIGITAL SCREENING BILAT W/ TOMO AND CAD
8 series · 8 of 24 positions shown · non-contrast
Comparison: Previous exam(s).

CLINICAL DATA: Screening.

EXAM:
DIGITAL SCREENING BILATERAL MAMMOGRAM WITH TOMOSYNTHESIS AND CAD
TECHNIQUE: Bilateral screening digital craniocaudal and mediolateral oblique
mammograms were obtained. Bilateral screening digital breast
tomosynthesis was performed. The images were evaluated with
computer-aided detection.

[R CC synth-2D]
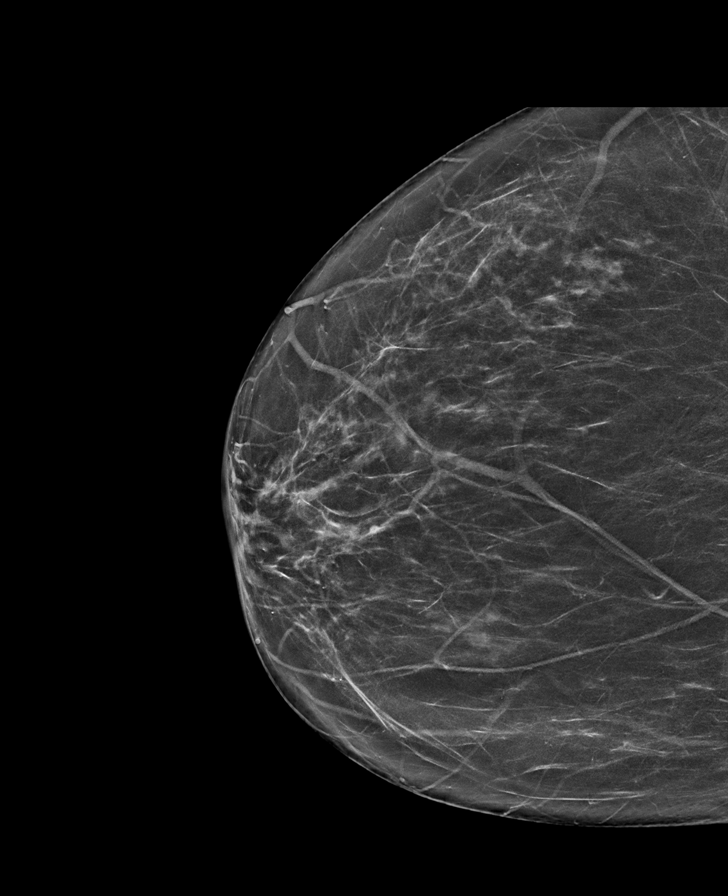

[L CC synth-2D]
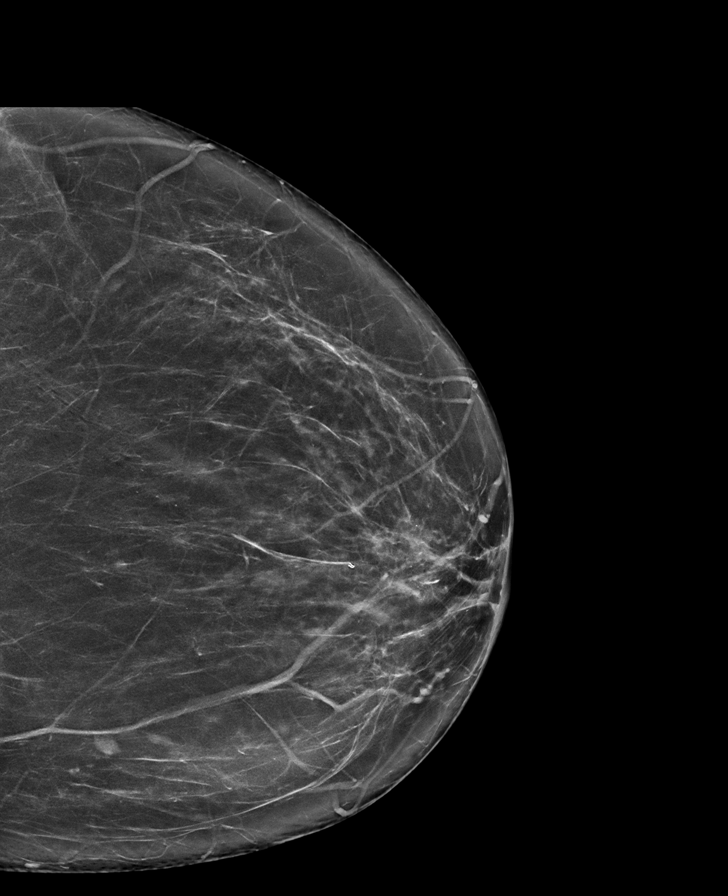

[L MLO synth-2D]
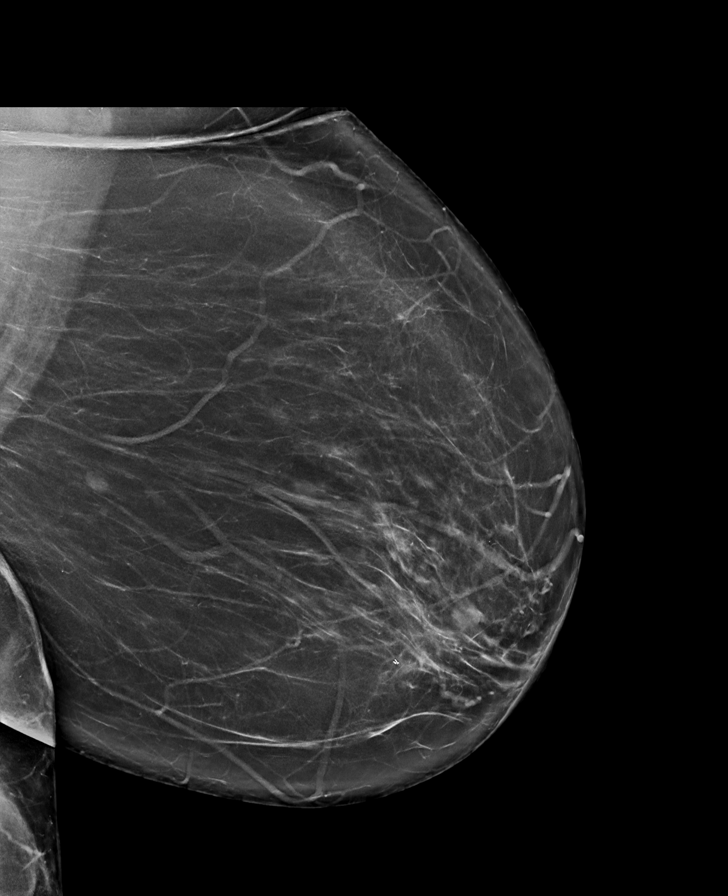

[R MLO synth-2D]
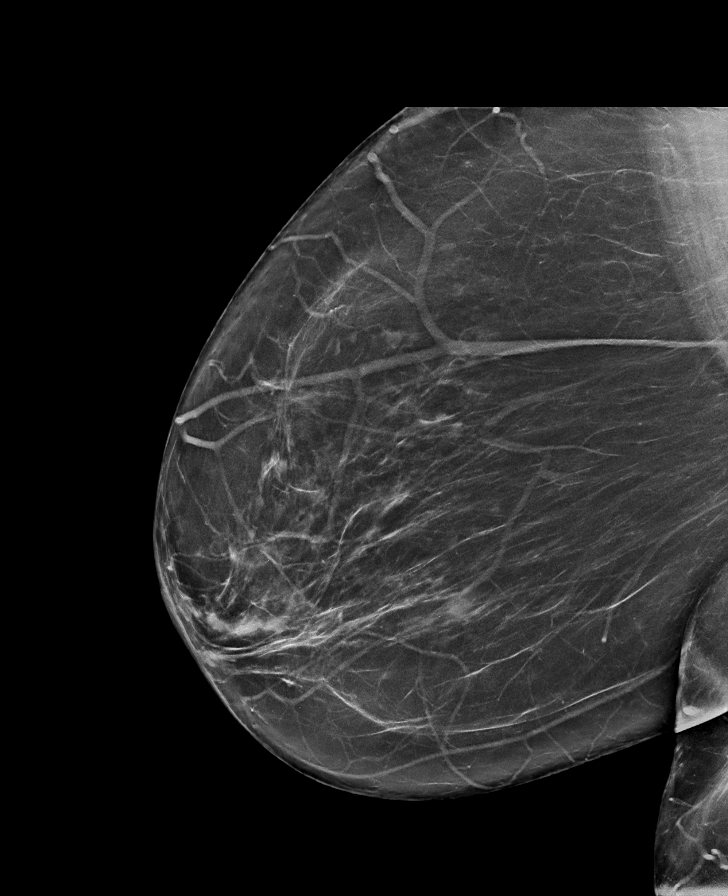

[L MLO tomo · tomo slice 44/87.0]
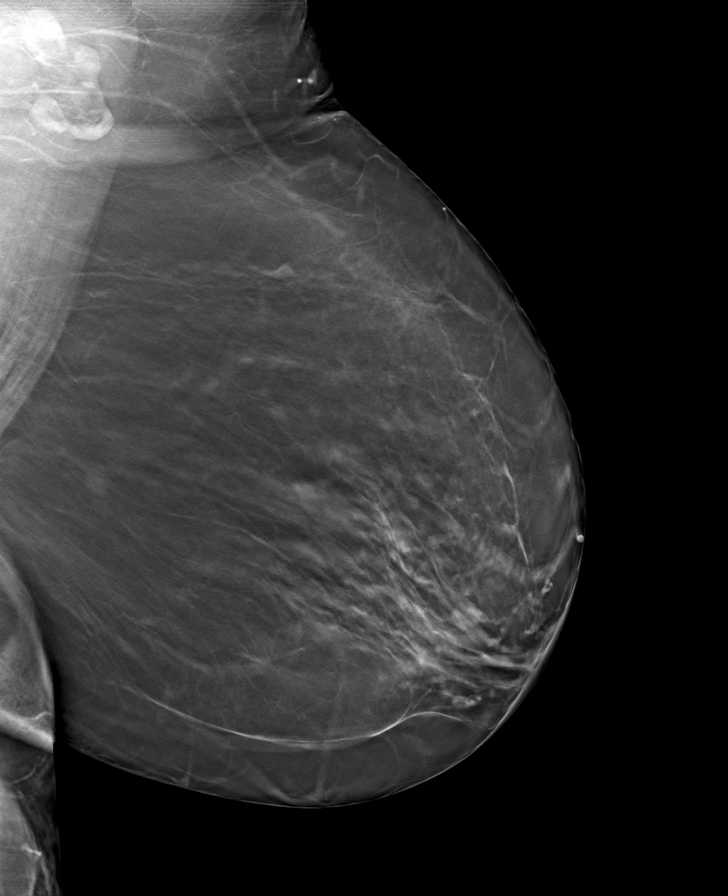

[L CC tomo · tomo slice 39/78.0]
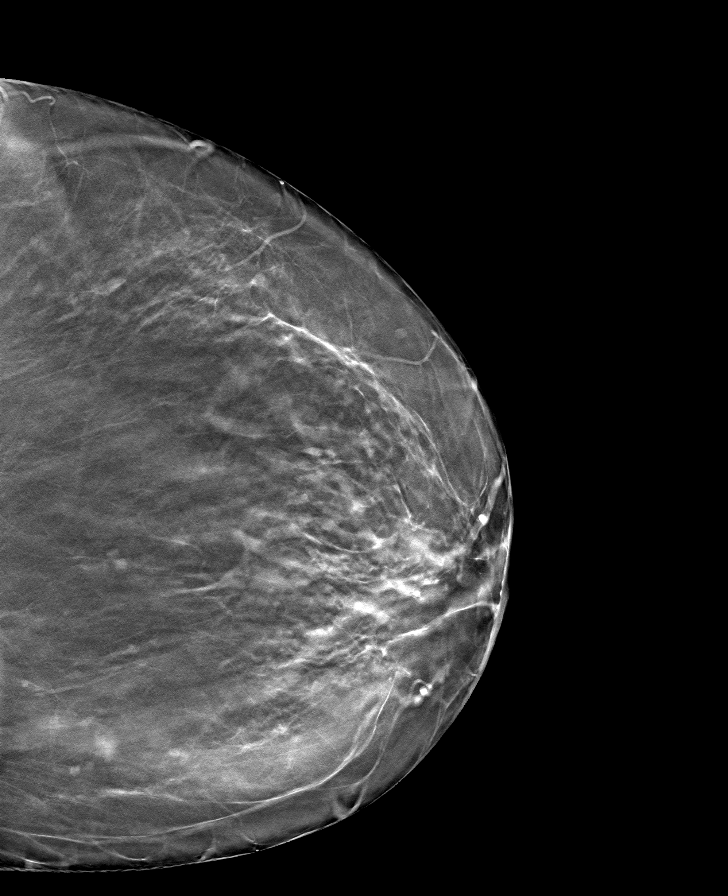

[R CC tomo · tomo slice 35/70.0]
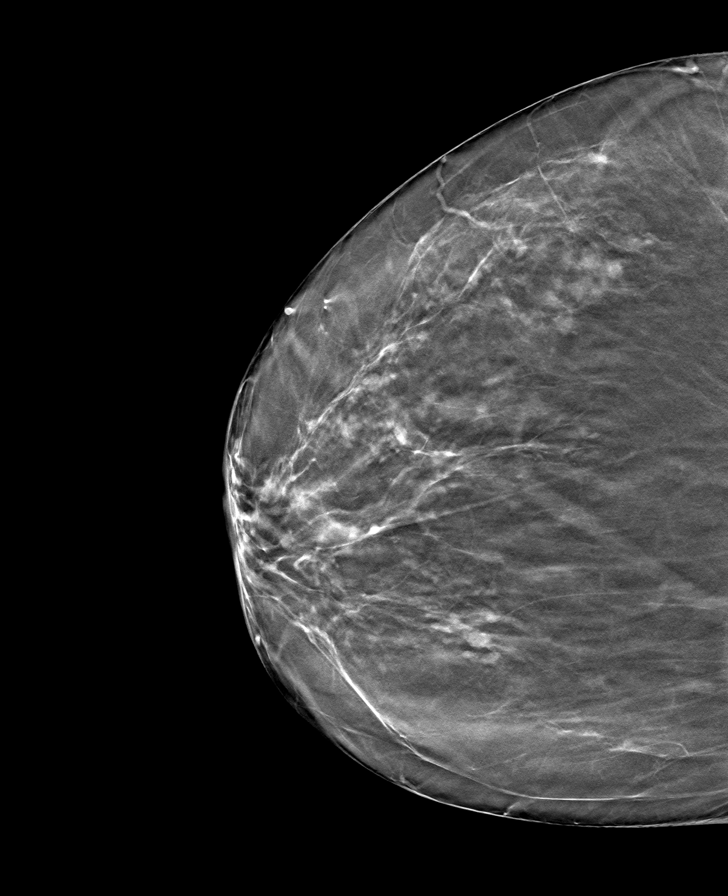

[R MLO tomo · tomo slice 41/82.0]
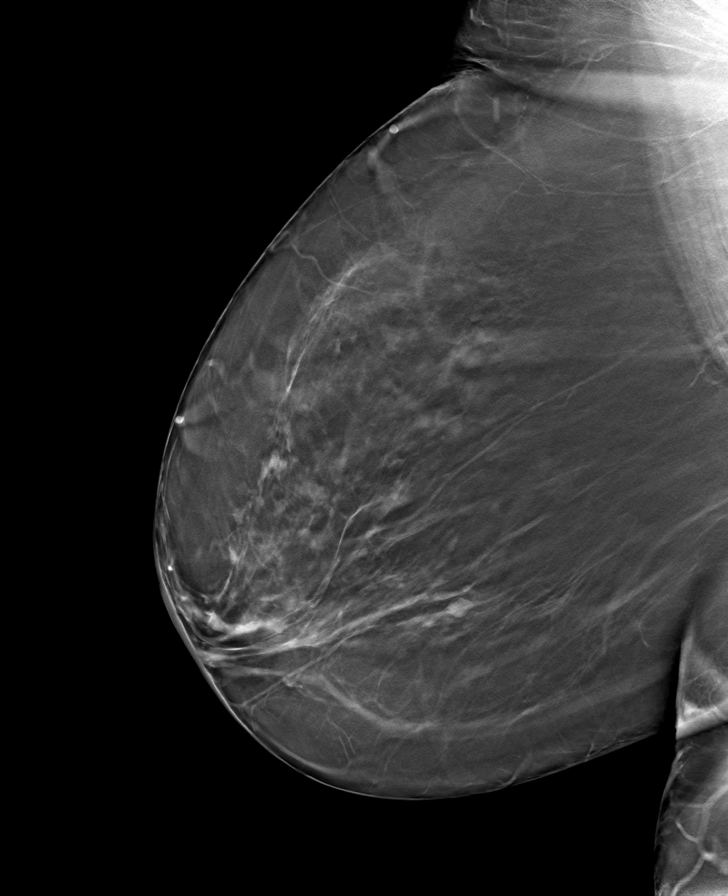

[8 of 24 positions shown; findings below may reference images not displayed]

ACR Breast Density Category b: There are scattered areas of
fibroglandular density.
FINDINGS: There are no findings suspicious for malignancy.
IMPRESSION: No mammographic evidence of malignancy. A result letter of this
screening mammogram will be mailed directly to the patient.

RECOMMENDATION:
Screening mammogram in one year. (Code:51-O-LD2)

BI-RADS CATEGORY  1: Negative.

## 2022-12-13 ENCOUNTER — Telehealth: Payer: Self-pay | Admitting: Internal Medicine

## 2022-12-13 ENCOUNTER — Other Ambulatory Visit (HOSPITAL_COMMUNITY): Payer: Self-pay

## 2022-12-13 ENCOUNTER — Other Ambulatory Visit: Payer: Self-pay | Admitting: Internal Medicine

## 2022-12-13 NOTE — Telephone Encounter (Signed)
Patient needs a prior authorization started for [facility]. Best callback is [phone].

## 2022-12-13 NOTE — Telephone Encounter (Signed)
Patient said she was informed that she needs a prior authorization for her Mounjaro. A message has been sent to the PA team and patient is aware, but she said she still would like to speak with a nurse. Best callback is [phone].

## 2022-12-14 ENCOUNTER — Ambulatory Visit (INDEPENDENT_AMBULATORY_CARE_PROVIDER_SITE_OTHER): Payer: 59

## 2022-12-14 ENCOUNTER — Telehealth: Payer: Self-pay

## 2022-12-14 ENCOUNTER — Other Ambulatory Visit (HOSPITAL_COMMUNITY): Payer: Self-pay

## 2022-12-14 VITALS — Ht 63.0 in | Wt 180.0 lb

## 2022-12-14 DIAGNOSIS — Z0000 Encounter for general adult medical examination without abnormal findings: Secondary | ICD-10-CM

## 2022-12-14 MED ORDER — MOUNJARO 10 MG/0.5ML ~~LOC~~ SOAJ
10.0000 mg | SUBCUTANEOUS | 0 refills | Status: DC
  Filled 2022-12-14: qty 2, 28d supply, fill #0

## 2022-12-14 NOTE — Progress Notes (Signed)
Subjective:   Coby Bartoli is a 77 y.o. female who presents for Medicare Annual (Subsequent) preventive examination.  Visit Complete: Virtual  I connected with  Cleophus Molt on 12/14/22 by a audio enabled telemedicine application and verified that I am speaking with the correct person using two identifiers.  Patient Location: Home  Provider Location: Office/Clinic  I discussed the limitations of evaluation and management by telemedicine. The patient expressed understanding and agreed to proceed.  Vital Signs: Because this visit was a virtual/telehealth visit, some criteria may be missing or patient reported. Any vitals not documented were not able to be obtained and vitals that have been documented are patient reported.    Review of Systems     Cardiac Risk Factors include: advanced age (>67men, >34 women);diabetes mellitus;dyslipidemia;family history of premature cardiovascular disease;hypertension;obesity (BMI >30kg/m2)     Objective:    Today's Vitals   12/14/22 1132  Weight: 180 lb (81.6 kg)  Height: 5\' 3"  (1.6 m)  PainSc: 0-No pain   Body mass index is 31.89 kg/m.     12/14/2022   11:38 AM 07/01/2021   12:35 PM 06/29/2021   10:08 AM  Advanced Directives  Does Patient Have a Medical Advance Directive? No No No  Would patient like information on creating a medical advance directive? No - Patient declined No - Patient declined No - Patient declined    Current Medications (verified) Outpatient Encounter Medications as of 12/14/2022  Medication Sig   Continuous Glucose Receiver (FREESTYLE LIBRE 14 DAY READER) DEVI UAD to monitor sugars.  E11.9, NIDM   Continuous Glucose Receiver (FREESTYLE LIBRE 3 READER) DEVI Use glucose reader to check glucose as directed   Continuous Glucose Sensor (FREESTYLE LIBRE 14 DAY SENSOR) MISC UAD to monitor sugars.  E11.9, NIDM   Continuous Glucose Sensor (FREESTYLE LIBRE 3 SENSOR) MISC Place 1 sensor on the skin every 14  days. Use to check glucose continuously   FARXIGA 10 MG TABS tablet Take 1 tablet (10 mg total) by mouth daily.   losartan (COZAAR) 25 MG tablet Take 1 tablet (25 mg total) by mouth daily.   rosuvastatin (CRESTOR) 40 MG tablet Take 1 tablet (40 mg total) by mouth daily.   tirzepatide (MOUNJARO) 10 MG/0.5ML Pen Inject 10 mg into the skin once a week.   venlafaxine XR (EFFEXOR-XR) 150 MG 24 hr capsule Take 1 capsule (150 mg total) by mouth daily with breakfast.   No facility-administered encounter medications on file as of 12/14/2022.    Allergies (verified) Codeine   History: Past Medical History:  Diagnosis Date   Anxiety    Arthritis    Depression    Diabetes mellitus without complication (HCC)    Diverticulitis    History of colon polyps    Hyperlipidemia    IBS (irritable bowel syndrome)    Past Surgical History:  Procedure Laterality Date   ABDOMINAL EXPLORATION SURGERY     ABDOMINAL HYSTERECTOMY     BREAST BIOPSY Left    CATARACT EXTRACTION, BILATERAL Bilateral    2024   COLONOSCOPY     DILATION AND CURETTAGE OF UTERUS     x 3   POLYPECTOMY     TONSILLECTOMY     Family History  Problem Relation Age of Onset   Hypertension Mother    Bladder Cancer Father    Heart disease Father    Diabetes Paternal Grandmother    Colon cancer Neg Hx    Esophageal cancer Neg Hx  Liver cancer Neg Hx    Pancreatic cancer Neg Hx    Rectal cancer Neg Hx    Stomach cancer Neg Hx    Social History   Socioeconomic History   Marital status: Single    Spouse name: Not on file   Number of children: 1   Years of education: Not on file   Highest education level: Not on file  Occupational History   Occupation: retired   Occupation: PT recepionist  Tobacco Use   Smoking status: Never   Smokeless tobacco: Never  Vaping Use   Vaping status: Never Used  Substance and Sexual Activity   Alcohol use: Yes    Alcohol/week: 0.0 standard drinks of alcohol    Comment: wine  cooler-ocassional   Drug use: No   Sexual activity: Not Currently    Birth control/protection: Surgical  Other Topics Concern   Not on file  Social History Narrative   Exercise: [employer], walking      Moved from [place]   Social Determinants of Health   Financial Resource Strain: Low Risk  (12/14/2022)   Overall Financial Resource Strain (CARDIA)    Difficulty of Paying Living Expenses: Not hard at all  Food Insecurity: No Food Insecurity (12/14/2022)   Hunger Vital Sign    Worried About Running Out of Food in the Last Year: Never true    Ran Out of Food in the Last Year: Never true  Transportation Needs: No Transportation Needs (12/14/2022)   PRAPARE - Administrator, Civil Service (Medical): No    Lack of Transportation (Non-Medical): No  Physical Activity: Sufficiently Active (12/14/2022)   Exercise Vital Sign    Days of Exercise per Week: 3 days    Minutes of Exercise per Session: 60 min  Stress: No Stress Concern Present (12/14/2022)   [employer] of Occupational Health - Occupational Stress Questionnaire    Feeling of Stress : Not at all  Social Connections: Moderately Integrated (12/14/2022)   Social Connection and Isolation Panel [NHANES]    Frequency of Communication with Friends and Family: More than three times a week    Frequency of Social Gatherings with Friends and Family: More than three times a week    Attends Religious Services: More than 4 times per year    Active Member of [employer] or Organizations: Yes    Attends Engineer, structural: More than 4 times per year    Marital Status: Never married    Tobacco Counseling Counseling given: Not Answered   Clinical Intake:  Pre-visit preparation completed: Yes  Pain : No/denies pain Pain Score: 0-No pain     BMI - recorded: 31.89 Nutritional Status: BMI > 30  Obese Nutritional Risks: None Diabetes: Yes CBG done?: No Did pt. bring in CBG monitor from home?: No  How often do you need to have  someone help you when you read instructions, pamphlets, or other written materials from your doctor or pharmacy?: 1 - Never What is the last grade level you completed in school?: MASTER'S DEGREE  Interpreter Needed?: No  Information entered by :: Darl Brisbin N. Novalie Leamy, LPN.   Activities of Daily Living    12/14/2022   11:42 AM  In your present state of health, do you have any difficulty performing the following activities:  Hearing? 0  Vision? 0  Difficulty concentrating or making decisions? 0  Walking or climbing stairs? 0  Dressing or bathing? 0  Doing errands, shopping? 0  Preparing Food and eating ?  N  Using the Toilet? N  In the past six months, have you accidently leaked urine? Y  Comment wears protection  Do you have problems with loss of bowel control? N  Managing your Medications? N  Managing your Finances? N  Housekeeping or managing your Housekeeping? N    Patient Care Team: Pincus Sanes, MD as PCP - General (Internal Medicine) [facility], [place] as Consulting Physician (Optometry)  Indicate any recent Medical Services you may have received from other than [facility] providers in the past year (date may be approximate).     Assessment:   This is a routine wellness examination for Glora.  Hearing/Vision screen Hearing Screening - Comments:: Denies hearing difficulties   Vision Screening - Comments:: Wear readers 2.00 - up to date with routine eye exams with [facility]; Cataracts removed 2024   Goals Addressed   None   Depression Screen    12/14/2022   11:39 AM 09/25/2022    4:01 PM 04/12/2022    3:31 PM 01/30/2022    8:49 AM 09/30/2021    7:56 AM 07/18/2021    2:46 PM 06/29/2021   10:21 AM  PHQ 2/9 Scores  PHQ - 2 Score 3 0 0 0 0 0 0  PHQ- 9 Score 4  2        Fall Risk    12/14/2022   11:39 AM 09/25/2022    4:00 PM 04/12/2022    3:30 PM 01/30/2022    8:48 AM 09/30/2021    7:56 AM  Fall Risk   Falls in the past year? 1 0 0 0 0  Number falls  in past yr: 0 0 0 0   Injury with Fall? 0 0 0 0   Risk for fall due to : No Fall Risks No Fall Risks No Fall Risks No Fall Risks   Follow up Falls prevention discussed Falls evaluation completed Falls evaluation completed Falls evaluation completed     MEDICARE RISK AT HOME: Medicare Risk at Home Any stairs in or around the home?: Yes If so, are there any without handrails?: No Home free of loose throw rugs in walkways, pet beds, electrical cords, etc?: Yes Adequate lighting in your home to reduce risk of falls?: Yes Life alert?: No Use of a cane, walker or w/c?: No Grab bars in the bathroom?: Yes Shower chair or bench in shower?: Yes Elevated toilet seat or a handicapped toilet?: No  TIMED UP AND GO:  Was the test performed?  No    Cognitive Function:        12/14/2022   11:42 AM  6CIT Screen  What Year? 0 points  What month? 0 points  What time? 0 points  Count back from 20 0 points  Months in reverse 0 points  Repeat phrase 0 points  Total Score 0 points    Immunizations Immunization History  Administered Date(s) Administered   Fluad Quad(high Dose 65+) 12/02/2018, 04/16/2020, 01/30/2022   Influenza, High Dose Seasonal PF 03/27/2017, 01/29/2018   Influenza-Unspecified 01/12/2021   PFIZER(Purple Top)SARS-COV-2 Vaccination 06/06/2019, 07/01/2019, 01/24/2020   Pneumococcal Conjugate-13 09/25/2016   Pneumococcal Polysaccharide-23 10/29/2018   Tdap 01/29/2018   Zoster Recombinant(Shingrix) 04/21/2018, 06/20/2018    TDAP status: Up to date  Flu Vaccine status: Due, Education has been provided regarding the importance of this vaccine. Advised may receive this vaccine at local pharmacy or Health Dept. Aware to provide a copy of the vaccination record if obtained from local pharmacy or  Health Dept. Verbalized acceptance and understanding.  Pneumococcal vaccine status: Up to date  Covid-19 vaccine status: Completed vaccines  Qualifies for Shingles Vaccine? Yes    Zostavax completed No   Shingrix Completed?: Yes  Screening Tests Health Maintenance  Topic Date Due   INFLUENZA VACCINE  11/09/2022   COVID-19 Vaccine (4 - 2023-24 season) 12/10/2022   DEXA SCAN  01/31/2023 (Originally 12/19/2021)   HEMOGLOBIN A1C  03/27/2023   OPHTHALMOLOGY EXAM  05/23/2023   Diabetic kidney evaluation - eGFR measurement  09/25/2023   Diabetic kidney evaluation - Urine ACR  09/25/2023   FOOT EXAM  09/25/2023   Medicare Annual Wellness (AWV)  12/14/2023   DTaP/Tdap/Td (2 - Td or Tdap) 01/30/2028   Pneumonia Vaccine 61+ Years old  Completed   Hepatitis C Screening  Completed   Zoster Vaccines- Shingrix  Completed   HPV VACCINES  Aged Out   Colonoscopy  Discontinued    Health Maintenance  Health Maintenance Due  Topic Date Due   INFLUENZA VACCINE  11/09/2022   COVID-19 Vaccine (4 - 2023-24 season) 12/10/2022    Colorectal cancer screening: No longer required.   Mammogram status: Completed 07/11/2022. Repeat every year  Bone Density status: Completed 12/19/2016. Results reflect: Bone density results: NORMAL. Repeat every 5 years.  Lung Cancer Screening: (Low Dose CT Chest recommended if Age 25-80 years, 20 pack-year currently smoking OR have quit w/in 15years.) does not qualify.   Lung Cancer Screening Referral: no  Additional Screening:  Hepatitis C Screening: does qualify; Completed 09/25/2016  Vision Screening: Recommended annual ophthalmology exams for early detection of glaucoma and other disorders of the eye. Is the patient up to date with their annual eye exam?  Yes  Who is the provider or what is the name of the office in which the patient attends annual eye exams? [facility] If pt is not established with a provider, would they like to be referred to a provider to establish care? No .   Dental Screening: Recommended annual dental exams for proper oral hygiene  Diabetic Foot Exam: Diabetic Foot Exam: Completed 09/25/2022  Community Resource  Referral / Chronic Care Management: CRR required this visit?  No   CCM required this visit?  No     Plan:     I have personally reviewed and noted the following in the patient's chart:   Medical and social history Use of alcohol, tobacco or illicit drugs  Current medications and supplements including opioid prescriptions. Patient is not currently taking opioid prescriptions. Functional ability and status Nutritional status Physical activity Advanced directives List of other physicians Hospitalizations, surgeries, and ER visits in previous 12 months Vitals Screenings to include cognitive, depression, and falls Referrals and appointments  In addition, I have reviewed and discussed with patient certain preventive protocols, quality metrics, and best practice recommendations. A written personalized care plan for preventive services as well as general preventive health recommendations were provided to patient.     Mickeal Needy, LPN   11/14/5782   After Visit Summary: (Mail) Due to this being a telephonic visit, the after visit summary with patients personalized plan was offered to patient via mail   Nurse Notes: Normal cognitive status assessed by direct observation via telephone conversation by this Nurse Health Advisor. No abnormalities found.

## 2022-12-14 NOTE — Telephone Encounter (Signed)
Spoke with patient today. 

## 2022-12-14 NOTE — Patient Instructions (Signed)
Ms. Soderman , Thank you for taking time to come for your Medicare Wellness Visit. I appreciate your ongoing commitment to your health goals. Please review the following plan we discussed and let me know if I can assist you in the future.   Referrals/Orders/Follow-Ups/Clinician Recommendations: No  This is a list of the screening recommended for you and due dates:  Health Maintenance  Topic Date Due   Flu Shot  11/09/2022   COVID-19 Vaccine (4 - 2023-24 season) 12/10/2022   DEXA scan (bone density measurement)  01/31/2023*   Hemoglobin A1C  03/27/2023   Eye exam for diabetics  05/23/2023   Yearly kidney function blood test for diabetes  09/25/2023   Yearly kidney health urinalysis for diabetes  09/25/2023   Complete foot exam   09/25/2023   Medicare Annual Wellness Visit  12/14/2023   DTaP/Tdap/Td vaccine (2 - Td or Tdap) 01/30/2028   Pneumonia Vaccine  Completed   Hepatitis C Screening  Completed   Zoster (Shingles) Vaccine  Completed   HPV Vaccine  Aged Out   Colon Cancer Screening  Discontinued  *Topic was postponed. The date shown is not the original due date.    Advanced directives: (Copy Requested) Please bring a copy of your health care power of attorney and living will to the office to be added to your chart at your convenience.  Next Medicare Annual Wellness Visit scheduled for next year: Yes  *Preventive Care attachment *FALL PREVENTION attachment

## 2022-12-14 NOTE — Telephone Encounter (Signed)
Pharmacy Patient Advocate Encounter   Received notification from Pt Calls Messages that prior authorization for Mounjaro 10mg /0.72ml is required/requested.   Insurance verification completed.   The patient is insured through [facility] .   Per test claim: PA required; PA submitted to [facility] via CoverMyMeds Key/confirmation #/EOC [id number] Status is pending

## 2022-12-15 ENCOUNTER — Other Ambulatory Visit (HOSPITAL_COMMUNITY): Payer: Self-pay

## 2022-12-15 NOTE — Telephone Encounter (Signed)
Pharmacy Patient Advocate Encounter  Received notification from [facility] that Prior Authorization for [facility] 10mg /0.21ml has been APPROVED from 12/14/22 to 12/14/23   PA #/Case ID/Reference #: [id number]

## 2022-12-15 NOTE — Telephone Encounter (Signed)
Spoke with patient today. 

## 2023-01-07 ENCOUNTER — Encounter: Payer: Self-pay | Admitting: Internal Medicine

## 2023-01-07 NOTE — Progress Notes (Unsigned)
Subjective:    Patient ID: Veronica Carrillo, female    DOB: [birth year], 77 y.o.   MRN: [id number]     HPI Hamna is here for follow up of her chronic medical problems.  Moving to [place] next month.  Doing well.    Medications and allergies reviewed with patient and updated if appropriate.  Current Outpatient Medications on File Prior to Visit  Medication Sig Dispense Refill   Continuous Glucose Receiver (FREESTYLE LIBRE 3 READER) DEVI Use glucose reader to check glucose as directed 1 each 0   Continuous Glucose Sensor (FREESTYLE LIBRE 3 SENSOR) MISC Place 1 sensor on the skin every 14 days. Use to check glucose continuously 2 each 6   No current facility-administered medications on file prior to visit.     Review of Systems  Constitutional:  Negative for fever.  Respiratory:  Negative for cough, shortness of breath and wheezing.   Cardiovascular:  Negative for chest pain, palpitations and leg swelling.  Neurological:  Negative for light-headedness and headaches.       Objective:   Vitals:   01/08/23 0811  BP: 110/78  Pulse: 62  Temp: 98 F (36.7 C)  SpO2: 98%   BP Readings from Last 3 Encounters:  01/08/23 110/78  09/25/22 120/70  04/21/22 126/80   Wt Readings from Last 3 Encounters:  01/08/23 185 lb (83.9 kg)  12/14/22 180 lb (81.6 kg)  09/25/22 182 lb 3.2 oz (82.6 kg)   Body mass index is 32.77 kg/m.    Physical Exam Constitutional:      General: She is not in acute distress.    Appearance: Normal appearance.  HENT:     Head: Normocephalic and atraumatic.  Eyes:     Conjunctiva/sclera: Conjunctivae normal.  Cardiovascular:     Rate and Rhythm: Normal rate and regular rhythm.     Heart sounds: Normal heart sounds.  Pulmonary:     Effort: Pulmonary effort is normal. No respiratory distress.     Breath sounds: Normal breath sounds. No wheezing.  Musculoskeletal:     Cervical back: Neck supple.     Right lower leg: No edema.     Left  lower leg: No edema.  Lymphadenopathy:     Cervical: No cervical adenopathy.  Skin:    General: Skin is warm and dry.     Findings: No rash.  Neurological:     Mental Status: She is alert. Mental status is at baseline.  Psychiatric:        Mood and Affect: Mood normal.        Behavior: Behavior normal.        Lab Results  Component Value Date   WBC 7.5 09/25/2022   HGB 13.2 09/25/2022   HCT 40.3 09/25/2022   PLT 353.0 09/25/2022   GLUCOSE 89 01/08/2023   CHOL 171 01/08/2023   TRIG 134.0 01/08/2023   HDL 53.40 01/08/2023   LDLDIRECT 111.0 09/25/2022   LDLCALC 91 01/08/2023   ALT 17 01/08/2023   AST 24 01/08/2023   NA 140 01/08/2023   K 4.1 01/08/2023   CL 105 01/08/2023   CREATININE 1.52 (H) 01/08/2023   BUN 16 01/08/2023   CO2 29 01/08/2023   TSH 1.97 09/25/2022   HGBA1C 5.5 01/08/2023   MICROALBUR 1.3 09/25/2022     Assessment & Plan:    See Problem List for Assessment and Plan of chronic medical problems.

## 2023-01-07 NOTE — Patient Instructions (Addendum)
      Blood work was ordered.   The lab is on the first floor.    Medications changes include :   none

## 2023-01-08 ENCOUNTER — Ambulatory Visit (INDEPENDENT_AMBULATORY_CARE_PROVIDER_SITE_OTHER): Payer: 59 | Admitting: Internal Medicine

## 2023-01-08 ENCOUNTER — Other Ambulatory Visit (HOSPITAL_COMMUNITY): Payer: Self-pay

## 2023-01-08 VITALS — BP 110/78 | HR 62 | Temp 98.0°F | Ht 63.0 in | Wt 185.0 lb

## 2023-01-08 DIAGNOSIS — F3289 Other specified depressive episodes: Secondary | ICD-10-CM

## 2023-01-08 DIAGNOSIS — E1165 Type 2 diabetes mellitus with hyperglycemia: Secondary | ICD-10-CM

## 2023-01-08 DIAGNOSIS — N184 Chronic kidney disease, stage 4 (severe): Secondary | ICD-10-CM

## 2023-01-08 DIAGNOSIS — I10 Essential (primary) hypertension: Secondary | ICD-10-CM | POA: Diagnosis not present

## 2023-01-08 DIAGNOSIS — Z7985 Long-term (current) use of injectable non-insulin antidiabetic drugs: Secondary | ICD-10-CM

## 2023-01-08 DIAGNOSIS — E7849 Other hyperlipidemia: Secondary | ICD-10-CM | POA: Diagnosis not present

## 2023-01-08 DIAGNOSIS — F419 Anxiety disorder, unspecified: Secondary | ICD-10-CM

## 2023-01-08 LAB — LIPID PANEL
Cholesterol: 171 mg/dL (ref 0–200)
HDL: 53.4 mg/dL (ref >39.00)
LDL Cholesterol: 91 mg/dL (ref 0–99)
NonHDL: 118.02
Total CHOL/HDL Ratio: 3
Triglycerides: 134 mg/dL (ref 0.0–149.0)
VLDL: 26.8 mg/dL (ref 0.0–40.0)

## 2023-01-08 LAB — COMPREHENSIVE METABOLIC PANEL
ALT: 17 U/L (ref 0–35)
AST: 24 U/L (ref 0–37)
Albumin: 4.1 g/dL (ref 3.5–5.2)
Alkaline Phosphatase: 100 U/L (ref 39–117)
BUN: 16 mg/dL (ref 6–23)
CO2: 29 meq/L (ref 19–32)
Calcium: 9.4 mg/dL (ref 8.4–10.5)
Chloride: 105 meq/L (ref 96–112)
Creatinine, Ser: 1.52 mg/dL — ABNORMAL HIGH (ref 0.40–1.20)
GFR: 33.14 mL/min — ABNORMAL LOW (ref >60.00)
Glucose, Bld: 89 mg/dL (ref 70–99)
Potassium: 4.1 meq/L (ref 3.5–5.1)
Sodium: 140 meq/L (ref 135–145)
Total Bilirubin: 0.9 mg/dL (ref 0.2–1.2)
Total Protein: 7.4 g/dL (ref 6.0–8.3)

## 2023-01-08 LAB — HEMOGLOBIN A1C: Hgb A1c MFr Bld: 5.5 % (ref 4.6–6.5)

## 2023-01-08 MED ORDER — VENLAFAXINE HCL ER 150 MG PO CP24: 150.0000 mg | ORAL_CAPSULE | Freq: Every day | ORAL | 1 refills | Status: AC

## 2023-01-08 MED ORDER — FARXIGA 10 MG PO TABS: 10.0000 mg | ORAL_TABLET | Freq: Every day | ORAL | 1 refills | Status: AC

## 2023-01-08 MED ORDER — ROSUVASTATIN CALCIUM 40 MG PO TABS: 40.0000 mg | ORAL_TABLET | Freq: Every day | ORAL | 1 refills | Status: AC

## 2023-01-08 MED ORDER — LOSARTAN POTASSIUM 25 MG PO TABS: 25.0000 mg | ORAL_TABLET | Freq: Every day | ORAL | 1 refills | Status: AC

## 2023-01-08 MED ORDER — MOUNJARO 10 MG/0.5ML ~~LOC~~ SOAJ
10.0000 mg | SUBCUTANEOUS | 0 refills | Status: AC
Start: 2023-01-08 — End: ?
  Filled 2023-01-08: qty 2, 28d supply, fill #0
  Filled 2023-02-01: qty 2, 28d supply, fill #1

## 2023-01-08 NOTE — Assessment & Plan Note (Signed)
Chronic Blood pressure well controlled CMP Continue losartan 25 mg daily 

## 2023-01-08 NOTE — Assessment & Plan Note (Signed)
Chronic Sugars well-controlled Continue Mounjaro  7.5 mg weekly Continue to work on being active, healthy diet, decrease portions, low sugar/carb diet Continue Farxiga 10 mg daily for renal protection Check a1c

## 2023-01-08 NOTE — Assessment & Plan Note (Signed)
Chronic Controlled, Stable Continue Effexor 150 mg daily 

## 2023-01-08 NOTE — Assessment & Plan Note (Signed)
Chronic Check lipids, cmp Continue Crestor 40 mg daily

## 2023-01-08 NOTE — Assessment & Plan Note (Signed)
Chronic Following with nephrology Sugars well controlled, blood pressure well controlled Not taking any NSAIDs Stressed good water intake Continue weight loss efforts-Mounjaro is helping CMP

## 2023-01-09 ENCOUNTER — Other Ambulatory Visit (HOSPITAL_COMMUNITY): Payer: Self-pay

## 2023-02-01 ENCOUNTER — Other Ambulatory Visit (HOSPITAL_COMMUNITY): Payer: Self-pay

## 2023-02-01 ENCOUNTER — Encounter (HOSPITAL_COMMUNITY): Payer: Self-pay

## 2023-02-14 ENCOUNTER — Other Ambulatory Visit (HOSPITAL_COMMUNITY): Payer: Self-pay

## 2023-11-16 ENCOUNTER — Other Ambulatory Visit (HOSPITAL_COMMUNITY): Payer: Self-pay

## 2023-11-16 ENCOUNTER — Telehealth: Payer: Self-pay

## 2023-11-16 NOTE — Telephone Encounter (Signed)
 Pharmacy Patient Advocate Encounter   Received notification from CoverMyMeds that prior authorization for Mounjaro  10 is required/requested.   Insurance verification completed.   The patient is insured through [facility] .   Per test claim: Refill too soon. PA is not needed at this time. Medication was filled 11/14/23. Next eligible fill date is 12/04/23.

## 2023-12-18 ENCOUNTER — Ambulatory Visit: Payer: 59
